# Patient Record
Sex: Female | Born: 1970 | Race: White | Hispanic: No | Marital: Married | Smoking: Never smoker
Health system: Southern US, Community
[De-identification: ages and names within clinical notes are randomized; demographics above are authoritative.]

## PROBLEM LIST (undated history)

## (undated) DIAGNOSIS — F419 Anxiety disorder, unspecified: Secondary | ICD-10-CM

## (undated) DIAGNOSIS — E785 Hyperlipidemia, unspecified: Secondary | ICD-10-CM

## (undated) HISTORY — PX: BREAST BIOPSY: SHX20

## (undated) HISTORY — PX: BREAST EXCISIONAL BIOPSY: SUR124

## (undated) HISTORY — DX: Hyperlipidemia, unspecified: E78.5

## (undated) HISTORY — DX: Anxiety disorder, unspecified: F41.9

---

## 1999-01-10 HISTORY — PX: TUBAL LIGATION: SHX77

## 2004-03-10 ENCOUNTER — Ambulatory Visit: Payer: Self-pay

## 2006-10-25 ENCOUNTER — Ambulatory Visit: Payer: Self-pay

## 2009-01-21 ENCOUNTER — Ambulatory Visit: Payer: Self-pay

## 2010-09-09 ENCOUNTER — Ambulatory Visit: Payer: Self-pay

## 2013-09-19 DIAGNOSIS — E785 Hyperlipidemia, unspecified: Secondary | ICD-10-CM | POA: Insufficient documentation

## 2013-09-19 DIAGNOSIS — K5909 Other constipation: Secondary | ICD-10-CM | POA: Insufficient documentation

## 2013-09-19 DIAGNOSIS — F419 Anxiety disorder, unspecified: Secondary | ICD-10-CM | POA: Insufficient documentation

## 2014-12-17 ENCOUNTER — Other Ambulatory Visit: Payer: Self-pay | Admitting: Nurse Practitioner

## 2014-12-17 DIAGNOSIS — R140 Abdominal distension (gaseous): Secondary | ICD-10-CM

## 2014-12-22 ENCOUNTER — Ambulatory Visit
Admission: RE | Admit: 2014-12-22 | Discharge: 2014-12-22 | Disposition: A | Discharge status: Home / Self Care | Payer: BLUE CROSS/BLUE SHIELD | Source: Ambulatory Visit | Attending: Nurse Practitioner | Admitting: Nurse Practitioner

## 2014-12-22 DIAGNOSIS — R140 Abdominal distension (gaseous): Secondary | ICD-10-CM

## 2015-01-12 ENCOUNTER — Ambulatory Visit: Payer: BLUE CROSS/BLUE SHIELD

## 2015-10-21 ENCOUNTER — Other Ambulatory Visit: Payer: Self-pay | Admitting: Obstetrics and Gynecology

## 2015-10-21 DIAGNOSIS — Z1231 Encounter for screening mammogram for malignant neoplasm of breast: Secondary | ICD-10-CM

## 2015-11-16 ENCOUNTER — Ambulatory Visit
Admission: RE | Admit: 2015-11-16 | Discharge: 2015-11-16 | Disposition: A | Discharge status: Home / Self Care | Payer: BLUE CROSS/BLUE SHIELD | Source: Ambulatory Visit | Attending: Obstetrics and Gynecology | Admitting: Obstetrics and Gynecology

## 2015-11-16 ENCOUNTER — Encounter (HOSPITAL_COMMUNITY): Payer: Self-pay

## 2015-11-16 DIAGNOSIS — Z1231 Encounter for screening mammogram for malignant neoplasm of breast: Secondary | ICD-10-CM | POA: Diagnosis present

## 2015-11-17 ENCOUNTER — Encounter: Payer: Self-pay | Admitting: Obstetrics and Gynecology

## 2015-11-17 ENCOUNTER — Ambulatory Visit (INDEPENDENT_AMBULATORY_CARE_PROVIDER_SITE_OTHER): Payer: BLUE CROSS/BLUE SHIELD | Admitting: Obstetrics and Gynecology

## 2015-11-17 VITALS — BP 118/71 | HR 73 | Ht 63.0 in | Wt 128.7 lb

## 2015-11-17 DIAGNOSIS — Z01419 Encounter for gynecological examination (general) (routine) without abnormal findings: Secondary | ICD-10-CM | POA: Diagnosis not present

## 2015-11-17 DIAGNOSIS — Z87898 Personal history of other specified conditions: Secondary | ICD-10-CM | POA: Diagnosis not present

## 2015-11-17 DIAGNOSIS — N951 Menopausal and female climacteric states: Secondary | ICD-10-CM | POA: Diagnosis not present

## 2015-11-17 DIAGNOSIS — N912 Amenorrhea, unspecified: Secondary | ICD-10-CM | POA: Diagnosis not present

## 2015-11-17 DIAGNOSIS — Z8742 Personal history of other diseases of the female genital tract: Secondary | ICD-10-CM

## 2015-11-17 NOTE — Progress Notes (Signed)
GYNECOLOGY ANNUAL PHYSICAL EXAM PROGRESS NOTE  Subjective:    Kelby Famngela K Boggan is a 45 y.o. [phone] female who presents to establish care, and for an annual exam.  The patient is sexually active. The patient wears seatbelts: yes. The patient participates in regular exercise: no. Has the patient ever been transfused or tattooed?: yes, tattoed. The patient reports that there is not domestic violence in her life.   The patient has the following complaint(s) today:  - Patient notes that she has not had a menstrual cycle since April 2017.  Has taken a pregnancy test in May, which was negative.    Gynecologic History Patient's last menstrual period was 04/10/2015 (exact date). Menstrual History: OB History    Gravida Para Term Preterm AB Living   4 3 3   1 3    SAB TAB Ectopic Multiple Live Births   1       263      Menarche age: [age] Patient's last menstrual period was 04/10/2015 (exact date). Period Duration (Days): 3-4 Period Pattern: (!) Irregular Menstrual Flow: Moderate Dysmenorrhea: None  Contraception: tubal ligation History of STI's: Denies Last Pap: 2016. Results were: normal.  Notes h/o abnormal pap smear with colposcopy and biopsy in 2015. Last mammogram: 11/2015 (performed yesterday). Results were: pending.  Denies h/o abnormal mammograms.    Obstetric History   G4   P3   T3   P0   A1   L3    SAB1   TAB0   Ectopic0   Multiple0   Live Births3     # Outcome Date GA Lbr Len/2nd Weight Sex Delivery Anes PTL Lv  4 Term      Vag-Spont   LIV  3 SAB           2 Term      Vag-Spont   LIV  1 Term      Vag-Spont   LIV      Past Medical History:  Diagnosis Date  . Anxiety   . Hyperlipemia     Past Surgical History:  Procedure Laterality Date  . BREAST BIOPSY Left    neg  . TUBAL LIGATION  2001    Family History  Problem Relation Age of Onset  . Hypertension Mother   . Diabetes Father   . Hypertension Father   . COPD Father   . Cancer Sister   . Breast  cancer Neg Hx     Social History   Social History  . Marital status: Married    Spouse name: N/A  . Number of children: N/A  . Years of education: N/A   Occupational History  . Not on file.   Social History Main Topics  . Smoking status: Never Smoker  . Smokeless tobacco: Never Used  . Alcohol use No  . Drug use: No  . Sexual activity: Yes    Birth control/ protection: Surgical   Other Topics Concern  . Not on file   Social History Narrative  . No narrative on file    No current outpatient prescriptions on file prior to visit.   No current facility-administered medications on file prior to visit.     No Known Allergies   Review of Systems Constitutional: negative for chills, fatigue, fevers and sweats Eyes: negative for irritation, redness and visual disturbance Ears, nose, mouth, throat, and face: negative for hearing loss, nasal congestion, snoring and tinnitus Respiratory: negative for asthma, cough, sputum Cardiovascular: negative for chest pain, dyspnea,  exertional chest pressure/discomfort, irregular heart beat, palpitations and syncope Gastrointestinal: negative for abdominal pain, change in bowel habits, nausea and vomiting Genitourinary: negative for abnormal menstrual periods, genital lesions, sexual problems and vaginal discharge, dysuria and urinary incontinence Integument/breast: negative for breast lump, breast tenderness and nipple discharge Hematologic/lymphatic: negative for bleeding and easy bruising Musculoskeletal:negative for back pain and muscle weakness Neurological: negative for dizziness, headaches, vertigo and weakness Endocrine: negative for diabetic symptoms including polydipsia, polyuria and skin dryness Allergic/Immunologic: negative for hay fever and urticaria        Objective:  Blood pressure 118/71, pulse 73, height 5\' 3"  (1.6 m), weight 128 lb 11.2 oz (58.4 kg), last menstrual period 04/10/2015. Body mass index is 22.8  kg/m.  General Appearance:    Alert, cooperative, no distress, appears stated age  Head:    Normocephalic, without obvious abnormality, atraumatic  Eyes:    PERRL, conjunctiva/corneas clear, EOM's intact, both eyes  Ears:    Normal external ear canals, both ears  Nose:   Nares normal, septum midline, mucosa normal, no drainage or sinus tenderness  Throat:   Lips, mucosa, and tongue normal; teeth and gums normal  Neck:   Supple, symmetrical, trachea midline, no adenopathy; thyroid: no enlargement/tenderness/nodules; no carotid bruit or JVD  Back:     Symmetric, no curvature, ROM normal, no CVA tenderness  Lungs:     Clear to auscultation bilaterally, respirations unlabored  Chest Wall:    No tenderness or deformity   Heart:    Regular rate and rhythm, S1 and S2 normal, no murmur, rub or gallop  Breast Exam:    No tenderness, masses, or nipple abnormality  Abdomen:     Soft, non-tender, bowel sounds active all four quadrants, no masses, no organomegaly.    Genitalia:    Pelvic:external genitalia normal, vagina without lesions, discharge, or tenderness, rectovaginal septum  normal. Cervix normal in appearance, no cervical motion tenderness, no adnexal masses or tenderness.  Uterus normal size, shape, mobile, regular contours, nontender.  Rectal:    Normal external sphincter.  No hemorrhoids appreciated. Internal exam not done.   Extremities:   Extremities normal, atraumatic, no cyanosis or edema  Pulses:   2+ and symmetric all extremities  Skin:   Skin color, texture, turgor normal, no rashes or lesions  Lymph nodes:   Cervical, supraclavicular, and axillary nodes normal  Neurologic:   CNII-XII intact, normal strength, sensation and reflexes throughout   .  Labs:  No results found for: WBC, HGB, HCT, MCV, PLT  No results found for: CREATININE, BUN, NA, K, CL, CO2  No results found for: ALT, AST, GGT, ALKPHOS, BILITOT  No results found for: TSH   Assessment:    Healthy female exam.    H/o abnormal pap smear  Amenorrhea  Plan:     Pap smear performed today. Await pap smear results. Blood tests: Estradiol, FSH, LH and Progesterone level.  Patient notes having annual lab work completed in past few months.  Breast self exam technique reviewed and patient encouraged to perform self-exam monthly.  Await mammogram results.  Contraception: tubal ligation. Diagnosis of amenorrhea explained in detail, including differential.  Patient likely perimenopausal, will check labs.  Discussed healthy lifestyle modifications.  Declines flu vaccine.  RTC in 1 year for annual exam.    Hildred LaserAnika Talani Brazee, MD [facility]

## 2015-11-17 NOTE — Patient Instructions (Signed)

## 2015-11-18 ENCOUNTER — Encounter: Payer: Self-pay | Admitting: Obstetrics and Gynecology

## 2015-11-18 LAB — FSH/LH
FSH: 42.3 m[IU]/mL
LH: 35.4 m[IU]/mL

## 2015-11-18 LAB — ESTRADIOL: Estradiol: 58.9 pg/mL

## 2015-11-18 LAB — PROGESTERONE: Progesterone: 0.1 ng/mL

## 2015-11-21 LAB — PAP IG AND HPV HIGH-RISK
HPV, high-risk: NEGATIVE
PAP SMEAR COMMENT: 0

## 2015-12-16 ENCOUNTER — Telehealth: Payer: Self-pay | Admitting: Obstetrics and Gynecology

## 2015-12-16 NOTE — Telephone Encounter (Signed)
Pt going thru menapause and feels so drained. She would like suggestions for something to take for it?

## 2015-12-16 NOTE — Telephone Encounter (Addendum)
No, she does not require an appointment at this time. Please advise her of the following:   Eat a healthy and nutritious diet. Ask your health care provider if you need help changing your diet.  Drink enough fluid to keep your urine clear or pale yellow.  Practice ways of relaxing, such as yoga, meditation, massage therapy, or acupuncture.  Exercise regularly.  Change situations that cause you stress. Try to keep your work and personal routine reasonable.  Limit alcohol intake to no more than 1 drink per day for nonpregnant women and 2 drinks per day for men. One drink equals 12 ounces of beer, 5 ounces of wine, or 1 ounces of hard liquor.  Ensure adequate sleep.   Take a multivitamin.   She can also take OTC Vitamin B12 supplements over the counter. If her fatigue has not improved in 1 month, we can also check labs (such as her thyroid and vitamin levels) to assess if there is any other medical reason for her fatigue besides menopause, and discuss possible hormonal therapy in a visit.

## 2015-12-16 NOTE — Telephone Encounter (Signed)
Called pt she states that she is very fatigued regardless of the amount of rest she gets. Advised pt to begin taking vitamin B12 5,000 per Dr.Cherry to call back in 1 month if no improvement.

## 2015-12-16 NOTE — Telephone Encounter (Signed)
Please advise, pt was last seen 11/17/15 for an annual do you want her to come back in?

## 2015-12-16 NOTE — Telephone Encounter (Signed)
Called pt no answer. LM for pt to call back.  

## 2015-12-17 ENCOUNTER — Encounter: Payer: Self-pay | Admitting: Obstetrics and Gynecology

## 2016-06-15 ENCOUNTER — Other Ambulatory Visit: Payer: Self-pay | Admitting: Physician Assistant

## 2016-06-15 DIAGNOSIS — M5030 Other cervical disc degeneration, unspecified cervical region: Secondary | ICD-10-CM

## 2016-06-22 ENCOUNTER — Ambulatory Visit
Admission: RE | Admit: 2016-06-22 | Discharge: 2016-06-22 | Disposition: A | Discharge status: Home / Self Care | Payer: BLUE CROSS/BLUE SHIELD | Source: Ambulatory Visit | Attending: Physician Assistant | Admitting: Physician Assistant

## 2016-06-22 DIAGNOSIS — M5030 Other cervical disc degeneration, unspecified cervical region: Secondary | ICD-10-CM | POA: Diagnosis not present

## 2016-06-22 DIAGNOSIS — M4802 Spinal stenosis, cervical region: Secondary | ICD-10-CM | POA: Insufficient documentation

## 2016-06-28 ENCOUNTER — Encounter: Payer: Self-pay | Admitting: Obstetrics and Gynecology

## 2016-09-13 ENCOUNTER — Telehealth: Payer: Self-pay | Admitting: Obstetrics and Gynecology

## 2016-09-13 NOTE — Telephone Encounter (Signed)
Patient called stating she has low energy. She has tried b12 over the counter but it is not making a difference. Is there something else she can try? Please Advise.

## 2016-09-13 NOTE — Telephone Encounter (Signed)
Called pt, no answer. Unable to leave message as voicemail is full. Will send mychart message.  

## 2016-11-22 ENCOUNTER — Ambulatory Visit (INDEPENDENT_AMBULATORY_CARE_PROVIDER_SITE_OTHER): Payer: BLUE CROSS/BLUE SHIELD | Admitting: Obstetrics and Gynecology

## 2016-11-22 ENCOUNTER — Encounter: Payer: Self-pay | Admitting: Obstetrics and Gynecology

## 2016-11-22 VITALS — BP 121/76 | HR 72 | Ht 63.0 in | Wt 137.0 lb

## 2016-11-22 DIAGNOSIS — Z1231 Encounter for screening mammogram for malignant neoplasm of breast: Secondary | ICD-10-CM

## 2016-11-22 DIAGNOSIS — Z01419 Encounter for gynecological examination (general) (routine) without abnormal findings: Secondary | ICD-10-CM

## 2016-11-22 DIAGNOSIS — N951 Menopausal and female climacteric states: Secondary | ICD-10-CM | POA: Diagnosis not present

## 2016-11-22 DIAGNOSIS — Z124 Encounter for screening for malignant neoplasm of cervix: Secondary | ICD-10-CM | POA: Diagnosis not present

## 2016-11-22 DIAGNOSIS — R5383 Other fatigue: Secondary | ICD-10-CM

## 2016-11-22 DIAGNOSIS — Z1239 Encounter for other screening for malignant neoplasm of breast: Secondary | ICD-10-CM

## 2016-11-22 NOTE — Patient Instructions (Addendum)
Fatigue Fatigue is feeling tired all of the time, a lack of energy, or a lack of motivation. Occasional or mild fatigue is often a normal response to activity or life in general. However, long-lasting (chronic) or extreme fatigue may indicate an underlying medical condition. Follow these instructions at home: Watch your fatigue for any changes. The following actions may help to lessen any discomfort you are feeling:  Talk to your health care provider about how much sleep you need each night. Try to get the required amount every night.  Take medicines only as directed by your health care provider.  Eat a healthy and nutritious diet. Ask your health care provider if you need help changing your diet.  Drink enough fluid to keep your urine clear or pale yellow.  Practice ways of relaxing, such as yoga, meditation, massage therapy, or acupuncture.  Exercise regularly.  Change situations that cause you stress. Try to keep your work and personal routine reasonable.  Do not abuse illegal drugs.  Limit alcohol intake to no more than 1 drink per day for nonpregnant women and 2 drinks per day for men. One drink equals 12 ounces of beer, 5 ounces of wine, or 1 ounces of hard liquor.  Take a multivitamin, if directed by your health care provider.  Contact a health care provider if:  Your fatigue does not get better.  You have a fever.  You have unintentional weight loss or gain.  You have headaches.  You have difficulty: ? Falling asleep. ? Sleeping throughout the night.  You feel angry, guilty, anxious, or sad.  You are unable to have a bowel movement (constipation).  You skin is dry.  Your legs or another part of your body is swollen. Get help right away if:  You feel confused.  Your vision is blurry.  You feel faint or pass out.  You have a severe headache.  You have severe abdominal, pelvic, or back pain.  You have chest pain, shortness of breath, or an irregular or  fast heartbeat.  You are unable to urinate or you urinate less than normal.  You develop abnormal bleeding, such as bleeding from the rectum, vagina, nose, lungs, or nipples.  You vomit blood.  You have thoughts about harming yourself or committing suicide.  You are worried that you might harm someone else. This information is not intended to replace advice given to you by your health care provider. Make sure you discuss any questions you have with your health care provider. Document Released: 10/23/2006 Document Revised: 06/03/2015 Document Reviewed: 04/29/2013 Elsevier Interactive Patient Education  2018 [employer] Maintenance for Postmenopausal Women Menopause is a normal process in which your reproductive ability comes to an end. This process happens gradually over a span of months to years, usually between the ages of 75 and 64. Menopause is complete when you have missed 12 consecutive menstrual periods. It is important to talk with your health care provider about some of the most common conditions that affect postmenopausal women, such as heart disease, cancer, and bone loss (osteoporosis). Adopting a healthy lifestyle and getting preventive care can help to promote your health and wellness. Those actions can also lower your chances of developing some of these common conditions. What should I know about menopause? During menopause, you may experience a number of symptoms, such as:  Moderate-to-severe hot flashes.  Night sweats.  Decrease in sex drive.  Mood swings.  Headaches.  Tiredness.  Irritability.  Memory problems.  Insomnia.  Choosing to treat or not to treat menopausal changes is an individual decision that you make with your health care provider. What should I know about hormone replacement therapy and supplements? Hormone therapy products are effective for treating symptoms that are associated with menopause, such as hot flashes and night sweats.  Hormone replacement carries certain risks, especially as you become older. If you are thinking about using estrogen or estrogen with progestin treatments, discuss the benefits and risks with your health care provider. What should I know about heart disease and stroke? Heart disease, heart attack, and stroke become more likely as you age. This may be due, in part, to the hormonal changes that your body experiences during menopause. These can affect how your body processes dietary fats, triglycerides, and cholesterol. Heart attack and stroke are both medical emergencies. There are many things that you can do to help prevent heart disease and stroke:  Have your blood pressure checked at least every 1-2 years. High blood pressure causes heart disease and increases the risk of stroke.  If you are 15-7 years old, ask your health care provider if you should take aspirin to prevent a heart attack or a stroke.  Do not use any tobacco products, including cigarettes, chewing tobacco, or electronic cigarettes. If you need help quitting, ask your health care provider.  It is important to eat a healthy diet and maintain a healthy weight. ? Be sure to include plenty of vegetables, fruits, low-fat dairy products, and lean protein. ? Avoid eating foods that are high in solid fats, added sugars, or salt (sodium).  Get regular exercise. This is one of the most important things that you can do for your health. ? Try to exercise for at least 150 minutes each week. The type of exercise that you do should increase your heart rate and make you sweat. This is known as moderate-intensity exercise. ? Try to do strengthening exercises at least twice each week. Do these in addition to the moderate-intensity exercise.  Know your numbers.Ask your health care provider to check your cholesterol and your blood glucose. Continue to have your blood tested as directed by your health care provider.  What should I know about cancer  screening? There are several types of cancer. Take the following steps to reduce your risk and to catch any cancer development as early as possible. Breast Cancer  Practice breast self-awareness. ? This means understanding how your breasts normally appear and feel. ? It also means doing regular breast self-exams. Let your health care provider know about any changes, no matter how small.  If you are 55 or older, have a clinician do a breast exam (clinical breast exam or CBE) every year. Depending on your age, family history, and medical history, it may be recommended that you also have a yearly breast X-ray (mammogram).  If you have a family history of breast cancer, talk with your health care provider about genetic screening.  If you are at high risk for breast cancer, talk with your health care provider about having an MRI and a mammogram every year.  Breast cancer (BRCA) gene test is recommended for women who have family members with BRCA-related cancers. Results of the assessment will determine the need for genetic counseling and BRCA1 and for BRCA2 testing. BRCA-related cancers include these types: ? Breast. This occurs in males or females. ? Ovarian. ? Tubal. This may also be called fallopian tube cancer. ? Cancer of the abdominal or pelvic lining (peritoneal cancer). ?  Prostate. ? Pancreatic.  Cervical, Uterine, and Ovarian Cancer Your health care provider may recommend that you be screened regularly for cancer of the pelvic organs. These include your ovaries, uterus, and vagina. This screening involves a pelvic exam, which includes checking for microscopic changes to the surface of your cervix (Pap test).  For women ages 21-65, health care providers may recommend a pelvic exam and a Pap test every three years. For women ages 84-65, they may recommend the Pap test and pelvic exam, combined with testing for human papilloma virus (HPV), every five years. Some types of HPV increase your  risk of cervical cancer. Testing for HPV may also be done on women of any age who have unclear Pap test results.  Other health care providers may not recommend any screening for nonpregnant women who are considered low risk for pelvic cancer and have no symptoms. Ask your health care provider if a screening pelvic exam is right for you.  If you have had past treatment for cervical cancer or a condition that could lead to cancer, you need Pap tests and screening for cancer for at least 20 years after your treatment. If Pap tests have been discontinued for you, your risk factors (such as having a new sexual partner) need to be reassessed to determine if you should start having screenings again. Some women have medical problems that increase the chance of getting cervical cancer. In these cases, your health care provider may recommend that you have screening and Pap tests more often.  If you have a family history of uterine cancer or ovarian cancer, talk with your health care provider about genetic screening.  If you have vaginal bleeding after reaching menopause, tell your health care provider.  There are currently no reliable tests available to screen for ovarian cancer.  Lung Cancer Lung cancer screening is recommended for adults 78-70 years old who are at high risk for lung cancer because of a history of smoking. A yearly low-dose CT scan of the lungs is recommended if you:  Currently smoke.  Have a history of at least 30 pack-years of smoking and you currently smoke or have quit within the past 15 years. A pack-year is smoking an average of one pack of cigarettes per day for one year.  Yearly screening should:  Continue until it has been 15 years since you quit.  Stop if you develop a health problem that would prevent you from having lung cancer treatment.  Colorectal Cancer  This type of cancer can be detected and can often be prevented.  Routine colorectal cancer screening usually  begins at age 85 and continues through age 71.  If you have risk factors for colon cancer, your health care provider may recommend that you be screened at an earlier age.  If you have a family history of colorectal cancer, talk with your health care provider about genetic screening.  Your health care provider may also recommend using home test kits to check for hidden blood in your stool.  A small camera at the end of a tube can be used to examine your colon directly (sigmoidoscopy or colonoscopy). This is done to check for the earliest forms of colorectal cancer.  Direct examination of the colon should be repeated every 5-10 years until age 30. However, if early forms of precancerous polyps or small growths are found or if you have a family history or genetic risk for colorectal cancer, you may need to be screened more often.  Skin  Cancer  Check your skin from head to toe regularly.  Monitor any moles. Be sure to tell your health care provider: ? About any new moles or changes in moles, especially if there is a change in a mole's shape or color. ? If you have a mole that is larger than the size of a pencil eraser.  If any of your family members has a history of skin cancer, especially at a young age, talk with your health care provider about genetic screening.  Always use sunscreen. Apply sunscreen liberally and repeatedly throughout the day.  Whenever you are outside, protect yourself by wearing long sleeves, pants, a wide-brimmed hat, and sunglasses.  What should I know about osteoporosis? Osteoporosis is a condition in which bone destruction happens more quickly than new bone creation. After menopause, you may be at an increased risk for osteoporosis. To help prevent osteoporosis or the bone fractures that can happen because of osteoporosis, the following is recommended:  If you are 36-26 years old, get at least 1,000 mg of calcium and at least 600 mg of vitamin D per day.  If you  are older than age 81 but younger than age 13, get at least 1,200 mg of calcium and at least 600 mg of vitamin D per day.  If you are older than age 44, get at least 1,200 mg of calcium and at least 800 mg of vitamin D per day.  Smoking and excessive alcohol intake increase the risk of osteoporosis. Eat foods that are rich in calcium and vitamin D, and do weight-bearing exercises several times each week as directed by your health care provider. What should I know about how menopause affects my mental health? Depression may occur at any age, but it is more common as you become older. Common symptoms of depression include:  Low or sad mood.  Changes in sleep patterns.  Changes in appetite or eating patterns.  Feeling an overall lack of motivation or enjoyment of activities that you previously enjoyed.  Frequent crying spells.  Talk with your health care provider if you think that you are experiencing depression. What should I know about immunizations? It is important that you get and maintain your immunizations. These include:  Tetanus, diphtheria, and pertussis (Tdap) booster vaccine.  Influenza every year before the flu season begins.  Pneumonia vaccine.  Shingles vaccine.  Your health care provider may also recommend other immunizations. This information is not intended to replace advice given to you by your health care provider. Make sure you discuss any questions you have with your health care provider. Document Released: 02/17/2005 Document Revised: 07/16/2015 Document Reviewed: 09/29/2014 Elsevier Interactive Patient Education  2018 [employer].

## 2016-11-22 NOTE — Progress Notes (Signed)
GYNECOLOGY ANNUAL PHYSICAL EXAM PROGRESS NOTE  Subjective:    Laura Ruiz is a 46 y.o. [phone] female who presents for an annual exam.  She is currently postmenopausal (x 1 year) and denies postmenopausal bleeding.   The patient is sexually active.  The patient wears seatbelts: yes. The patient participates in regular exercise: no. Has the patient ever been transfused or tattooed?: yes, tattoed. She reports that there is not domestic violence in her life.   Marcelina has the following complaint(s) today:  - Complains of fatigue over the past year, worsening in the past several months.  Notes that ever since the start of menopause last year she has noticed it, but now has become worse.  - Noting hot flushes, mild to moderate, occurring at least several days out of the week.    Gynecologic History Patient's last menstrual period was 04/10/2015. Menstrual History: OB History    Gravida Para Term Preterm AB Living   4 3 3   1 3    SAB TAB Ectopic Multiple Live Births   1       26      Menarche age: 46 Patient's last menstrual period was 04/10/2015.    Contraception: tubal ligation History of STI's: Denies Last Pap: 2016. Results were: normal.  Notes h/o abnormal pap smear with colposcopy and biopsy in 2015. Last mammogram: 11/2015 (performed yesterday). Results were: pending.  Denies h/o abnormal mammograms.    Obstetric History   G4   P3   T3   P0   A1   L3    SAB1   TAB0   Ectopic0   Multiple0   Live Births3     # Outcome Date GA Lbr Len/2nd Weight Sex Delivery Anes PTL Lv  4 Term      Vag-Spont   LIV  3 SAB           2 Term      Vag-Spont   LIV  1 Term      Vag-Spont   LIV      Past Medical History:  Diagnosis Date  . Anxiety   . Hyperlipemia     Past Surgical History:  Procedure Laterality Date  . BREAST BIOPSY Left    neg  . TUBAL LIGATION  2001    Family History  Problem Relation Age of Onset  . Hypertension Mother   . Diabetes Father   .  Hypertension Father   . COPD Father   . Cancer Sister   . Breast cancer Neg Hx     Social History   Socioeconomic History  . Marital status: Married    Spouse name: Not on file  . Number of children: Not on file  . Years of education: Not on file  . Highest education level: Not on file  Social Needs  . Financial resource strain: Not on file  . Food insecurity - worry: Not on file  . Food insecurity - inability: Not on file  . Transportation needs - medical: Not on file  . Transportation needs - non-medical: Not on file  Occupational History  . Not on file  Tobacco Use  . Smoking status: Never Smoker  . Smokeless tobacco: Never Used  Substance and Sexual Activity  . Alcohol use: No  . Drug use: No  . Sexual activity: Yes    Birth control/protection: Surgical  Other Topics Concern  . Not on file  Social History Narrative  . Not on file  Current Outpatient Medications on File Prior to Visit  Medication Sig Dispense Refill  . ALPRAZolam (XANAX) 0.5 MG tablet TAKE ONE (1) TABLET EACH DAY AS NEEDED FOR PANIC ATTACKS    . citalopram (CELEXA) 40 MG tablet TAKE ONE (1) TABLET EACH DAY    . linaclotide (LINZESS) 290 MCG CAPS capsule TAKE ONE (1) CAPSULE EACH DAY    . Multiple Vitamin (MULTI-VITAMINS) TABS Take by mouth.    . Omega-3 Fatty Acids (FISH OIL PO) Take by mouth.    . simvastatin (ZOCOR) 20 MG tablet Take by mouth.     No current facility-administered medications on file prior to visit.     No Known Allergies   Review of Systems Constitutional: negative for chills, fevers and sweats.  Positive for fatigue (x 1 year).  Eyes: negative for irritation, redness and visual disturbance Ears, nose, mouth, throat, and face: negative for hearing loss, nasal congestion, snoring and tinnitus Respiratory: negative for asthma, cough, sputum Cardiovascular: negative for chest pain, dyspnea, exertional chest pressure/discomfort, irregular heart beat, palpitations and  syncope Gastrointestinal: negative for abdominal pain, change in bowel habits, nausea and vomiting Genitourinary: negative for abnormal menstrual periods, genital lesions, sexual problems and vaginal discharge, dysuria and urinary incontinence Integument/breast: negative for breast lump, breast tenderness and nipple discharge Hematologic/lymphatic: negative for bleeding and easy bruising Musculoskeletal:negative for back pain and muscle weakness Neurological: negative for dizziness, headaches, vertigo and weakness Endocrine: negative for diabetic symptoms including polydipsia, polyuria and skin dryness.  Positive for hot flushes.  Allergic/Immunologic: negative for hay fever and urticaria       Objective:  Blood pressure 121/76, pulse 72, height 5\' 3"  (1.6 m), weight 137 lb (62.1 kg), last menstrual period 04/10/2015. Body mass index is 24.27 kg/m.  General Appearance:    Alert, cooperative, no distress, appears stated age  Head:    Normocephalic, without obvious abnormality, atraumatic  Eyes:    PERRL, conjunctiva/corneas clear, EOM's intact, both eyes  Ears:    Normal external ear canals, both ears  Nose:   Nares normal, septum midline, mucosa normal, no drainage or sinus tenderness  Throat:   Lips, mucosa, and tongue normal; teeth and gums normal  Neck:   Supple, symmetrical, trachea midline, no adenopathy; thyroid: no enlargement/tenderness/nodules; no carotid bruit or JVD  Back:     Symmetric, no curvature, ROM normal, no CVA tenderness  Lungs:     Clear to auscultation bilaterally, respirations unlabored  Chest Wall:    No tenderness or deformity   Heart:    Regular rate and rhythm, S1 and S2 normal, no murmur, rub or gallop  Breast Exam:    No tenderness, masses, or nipple abnormality  Abdomen:     Soft, non-tender, bowel sounds active all four quadrants, no masses, no organomegaly.    Genitalia:    Pelvic:external genitalia normal, vagina without lesions, discharge, or tenderness,  rectovaginal septum  normal. Cervix normal in appearance, no cervical motion tenderness, no adnexal masses or tenderness.  Uterus normal size, shape, mobile, regular contours, nontender.  Rectal:    Normal external sphincter.  No hemorrhoids appreciated. Internal exam not done.   Extremities:   Extremities normal, atraumatic, no cyanosis or edema  Pulses:   2+ and symmetric all extremities  Skin:   Skin color, texture, turgor normal, no rashes or lesions  Lymph nodes:   Cervical, supraclavicular, and axillary nodes normal  Neurologic:   CNII-XII intact, normal strength, sensation and reflexes throughout   .  Labs:  No results found for: WBC, HGB, HCT, MCV, PLT  No results found for: CREATININE, BUN, NA, K, CL, CO2  No results found for: ALT, AST, GGT, ALKPHOS, BILITOT  No results found for: TSH   Assessment:   Routine gynecologic exam.  H/o abnormal pap smear Menopausal hot flushes Fatigue  Plan:    - Pap smear performed today. Await pap smear results.  If normal, can return to q 3 year screening.  - Blood tests: Vitamin D, TSH, Vitamin B12 levels ordered to assess for fatigue.  Has had annual labs performed by PCP (not in Epic) - Breast self exam technique reviewed and patient encouraged to perform self-exam monthly.  Mammogram ordered. - Contraception: tubal ligation. - Discussed healthy lifestyle modifications.   - Patient with bothersome menopausal vasomotor symptoms. Discussed lifestyle interventions such as wearing light clothing, remaining in cool environments, having fan/air conditioner in the room, avoiding hot beverages etc.  Discussed using hormone therapy and concerns about increased risk of heart disease, cerebrovascular disease, thromboembolic disease,  and breast cancer.  Also discussed other medical options such as Paxil, Effexor, Brisdelle, Clonidine,  or Neurontin.   Also discussed alternative therapies such as herbal remedies but cautioned that most of the  products contained phytoestrogens (plant estrogens) in unregulated amounts which can have the same effects on the body as the pharmaceutical estrogen preparations.  Also referred her to www.menopause.org for other alternative options. Patient desires to read over options first, but will likely try herbal remedies first.  - Fatigue worsening, may be secondary to menopause.  Discussed exercise, hormonal supplementation (HRT as mentioned for hot flushes), OTC supplementation. Has tried liquid B12 but did not notice much of a difference. Inquires into Vitamin B12 shots. Discussed need for assessment of labs, then will determine if appropriate.  - Follow up in 1 year, or sooner as needed.    Declines flu vaccine.  RTC in 1 year for annual exam.    Hildred Laserherry, Tameah Mihalko, MD [facility]

## 2016-11-23 LAB — TSH: TSH: 0.974 u[IU]/mL (ref 0.450–4.500)

## 2016-11-23 LAB — VITAMIN B12: Vitamin B-12: 1729 pg/mL — ABNORMAL HIGH (ref 232–1245)

## 2016-11-23 LAB — VITAMIN D 25 HYDROXY (VIT D DEFICIENCY, FRACTURES): Vit D, 25-Hydroxy: 22.7 ng/mL — ABNORMAL LOW (ref 30.0–100.0)

## 2016-11-26 LAB — IGP, COBASHPV16/18
HPV 16: NEGATIVE
HPV 18: NEGATIVE
HPV OTHER HR TYPES: NEGATIVE
PAP Smear Comment: 0

## 2017-07-27 ENCOUNTER — Telehealth: Payer: Self-pay | Admitting: Obstetrics and Gynecology

## 2017-07-27 NOTE — Telephone Encounter (Signed)
Pt was sent a MyChart message concerning her phone call to the office.

## 2017-07-27 NOTE — Telephone Encounter (Signed)
The patient called and asked if Dr. Valentino Saxonherry prescribes a specific medication, I informed the patient that a nurse will call her back and confirm if that is possible for Dr. Valentino Saxonherry to send that in for her. Please advise.

## 2017-07-27 NOTE — Telephone Encounter (Signed)
Pt was called no answer LM to call the office to discuss the reason for her call.

## 2017-11-28 ENCOUNTER — Ambulatory Visit (INDEPENDENT_AMBULATORY_CARE_PROVIDER_SITE_OTHER): Payer: BLUE CROSS/BLUE SHIELD | Admitting: Obstetrics and Gynecology

## 2017-11-28 ENCOUNTER — Encounter: Payer: Self-pay | Admitting: Obstetrics and Gynecology

## 2017-11-28 VITALS — BP 136/87 | HR 76 | Ht 63.0 in | Wt 134.0 lb

## 2017-11-28 DIAGNOSIS — N951 Menopausal and female climacteric states: Secondary | ICD-10-CM

## 2017-11-28 DIAGNOSIS — R5382 Chronic fatigue, unspecified: Secondary | ICD-10-CM | POA: Diagnosis not present

## 2017-11-28 DIAGNOSIS — Z1239 Encounter for other screening for malignant neoplasm of breast: Secondary | ICD-10-CM

## 2017-11-28 DIAGNOSIS — Z01419 Encounter for gynecological examination (general) (routine) without abnormal findings: Secondary | ICD-10-CM | POA: Diagnosis not present

## 2017-11-28 NOTE — Progress Notes (Signed)
PT is present today for her annual exam. Pt stated that she has been doing self-breast exams monthly. Pt stated that she is doing well and denies any issues. No problems or concerns.     

## 2017-11-28 NOTE — Progress Notes (Signed)
GYNECOLOGY ANNUAL PHYSICAL EXAM PROGRESS NOTE  Subjective:    Laura Ruiz is a 47 y.o. [phone] female who presents for an annual exam.  She is currently postmenopausal (x 2 years) and denies postmenopausal bleeding.   The patient is sexually active.  The patient wears seatbelts: yes. The patient participates in regular exercise: no. Has the patient ever been transfused or tattooed?: yes, tattoed. She reports that there is not domestic violence in her life.   Laura Ruiz has the following complaint(s) today:  1.  None   Gynecologic History  Menarche age: 58 Patient's last menstrual period was 04/10/2015. Patient is postmenopausal.  Contraception: tubal ligation History of STI's: Denies Last Pap: 11/2016. Results were: normal.  Notes h/o abnormal pap smear with colposcopy and biopsy in 2015. Last mammogram: 11/2015. Results were: normal.  Denies h/o abnormal mammograms.    OB History  Gravida Para Term Preterm AB Living  4 3 3  0 1 3  SAB TAB Ectopic Multiple Live Births  1 0 0 0 3    # Outcome Date GA Lbr Len/2nd Weight Sex Delivery Anes PTL Lv  4 Term      Vag-Spont   LIV  3 SAB           2 Term      Vag-Spont   LIV  1 Term      Vag-Spont   LIV    Past Medical History:  Diagnosis Date  . Anxiety   . Hyperlipemia     Past Surgical History:  Procedure Laterality Date  . BREAST BIOPSY Left    neg  . TUBAL LIGATION  2001    Family History  Problem Relation Age of Onset  . Hypertension Mother   . Diabetes Father   . Hypertension Father   . COPD Father   . Cancer Sister   . Breast cancer Neg Hx     Social History   Socioeconomic History  . Marital status: Married    Spouse name: Not on file  . Number of children: Not on file  . Years of education: Not on file  . Highest education level: Not on file  Occupational History  . Not on file  Social Needs  . Financial resource strain: Not on file  . Food insecurity:    Worry: Not on file    Inability: Not  on file  . Transportation needs:    Medical: Not on file    Non-medical: Not on file  Tobacco Use  . Smoking status: Never Smoker  . Smokeless tobacco: Never Used  Substance and Sexual Activity  . Alcohol use: No  . Drug use: No  . Sexual activity: Yes    Birth control/protection: Surgical  Lifestyle  . Physical activity:    Days per week: Not on file    Minutes per session: Not on file  . Stress: Not on file  Relationships  . Social connections:    Talks on phone: Not on file    Gets together: Not on file    Attends religious service: Not on file    Active member of club or organization: Not on file    Attends meetings of clubs or organizations: Not on file    Relationship status: Not on file  . Intimate partner violence:    Fear of current or ex partner: Not on file    Emotionally abused: Not on file    Physically abused: Not on file  Forced sexual activity: Not on file  Other Topics Concern  . Not on file  Social History Narrative  . Not on file    Current Outpatient Medications on File Prior to Visit  Medication Sig Dispense Refill  . ALPRAZolam (XANAX) 0.5 MG tablet TAKE ONE (1) TABLET EACH DAY AS NEEDED FOR PANIC ATTACKS    . citalopram (CELEXA) 40 MG tablet TAKE ONE (1) TABLET EACH DAY    . linaclotide (LINZESS) 290 MCG CAPS capsule TAKE ONE (1) CAPSULE EACH DAY    . Multiple Vitamin (MULTI-VITAMINS) TABS Take by mouth.    . Omega-3 Fatty Acids (FISH OIL PO) Take by mouth.    . rosuvastatin (CRESTOR) 40 MG tablet Take 40 mg by mouth daily.     No current facility-administered medications on file prior to visit.     No Known Allergies   Review of Systems Constitutional: negative for chills, fevers and sweats.  Positive for fatigue. Eyes: negative for irritation, redness and visual disturbance Ears, nose, mouth, throat, and face: negative for hearing loss, nasal congestion, snoring and tinnitus Respiratory: negative for asthma, cough,  sputum Cardiovascular: negative for chest pain, dyspnea, exertional chest pressure/discomfort, irregular heart beat, palpitations and syncope Gastrointestinal: negative for abdominal pain, change in bowel habits, nausea and vomiting Genitourinary: negative for abnormal menstrual periods, genital lesions, sexual problems and vaginal discharge, dysuria and urinary incontinence Integument/breast: negative for breast lump, breast tenderness and nipple discharge Hematologic/lymphatic: negative for bleeding and easy bruising Musculoskeletal:negative for back pain and muscle weakness Neurological: negative for dizziness, headaches, vertigo and weakness Endocrine: negative for diabetic symptoms including polydipsia, polyuria and skin dryness.  Positive for hot flushes (mild, not very bothersome).  Allergic/Immunologic: negative for hay fever and urticaria       Objective:  Blood pressure 136/87, pulse 76, height 5\' 3"  (1.6 m), weight 134 lb (60.8 kg). Body mass index is 23.74 kg/m.  General Appearance:    Alert, cooperative, no distress, appears stated age  Head:    Normocephalic, without obvious abnormality, atraumatic  Eyes:    PERRL, conjunctiva/corneas clear, EOM's intact, both eyes  Ears:    Normal external ear canals, both ears  Nose:   Nares normal, septum midline, mucosa normal, no drainage or sinus tenderness  Throat:   Lips, mucosa, and tongue normal; teeth and gums normal  Neck:   Supple, symmetrical, trachea midline, no adenopathy; thyroid: no enlargement/tenderness/nodules; no carotid bruit or JVD  Back:     Symmetric, no curvature, ROM normal, no CVA tenderness  Lungs:     Clear to auscultation bilaterally, respirations unlabored  Chest Wall:    No tenderness or deformity   Heart:    Regular rate and rhythm, S1 and S2 normal, no murmur, rub or gallop  Breast Exam:    No tenderness, masses, or nipple abnormality  Abdomen:     Soft, non-tender, bowel sounds active all four quadrants,  no masses, no organomegaly.    Genitalia:    Pelvic:external genitalia normal, vagina without lesions, discharge, or tenderness, rectovaginal septum  normal. Cervix normal in appearance, no cervical motion tenderness, no adnexal masses or tenderness.  Uterus normal size, shape, mobile, regular contours, nontender.  Rectal:    Normal external sphincter.  No hemorrhoids appreciated. Internal exam not done.   Extremities:   Extremities normal, atraumatic, no cyanosis or edema  Pulses:   2+ and symmetric all extremities  Skin:   Skin color, texture, turgor normal, no rashes or lesions  Lymph nodes:  Cervical, supraclavicular, and axillary nodes normal  Neurologic:   CNII-XII intact, normal strength, sensation and reflexes throughout   .  Labs:   Lab Results  Component Value Date   TSH 0.974 11/22/2016   All other labs reviewed in Care Everywhere (07/04/2017 is date of last labs).  Assessment:   Routine gynecologic exam.  smear Fatigue Vasomotor symptoms  Plan:  - Labs: up to date.  Reviewed in Care Everywhere - Pap smear up to date.  H/o abnormal pap smear with 3 consecutive normal pap smears.  Can return to q 3 year screening.  - Breast self exam technique reviewed and patient encouraged to perform self-exam monthly.  Mammogram ordered. - Contraception: tubal ligation. - Discussed healthy lifestyle modifications.   - Fatigue still present, had negative workup last year.  Continued to encourage exercise, adequate rest, can use OTC vitamin or herbal supplementation.  - Vasomotor symptoms, mild, not bothersome.  Declines treatment at this time.  - Follow up in 1 year, or sooner as needed.  - Declines flu vaccine.  - RTC in 1 year for annual exam.    Hildred Laser, MD [facility]

## 2017-12-01 ENCOUNTER — Encounter: Payer: Self-pay | Admitting: Obstetrics and Gynecology

## 2018-01-10 ENCOUNTER — Ambulatory Visit
Admission: RE | Admit: 2018-01-10 | Discharge: 2018-01-10 | Disposition: A | Discharge status: Home / Self Care | Payer: BLUE CROSS/BLUE SHIELD | Source: Ambulatory Visit | Attending: Obstetrics and Gynecology | Admitting: Obstetrics and Gynecology

## 2018-01-10 DIAGNOSIS — Z1239 Encounter for other screening for malignant neoplasm of breast: Secondary | ICD-10-CM | POA: Diagnosis present

## 2018-03-15 ENCOUNTER — Other Ambulatory Visit: Payer: Self-pay | Admitting: Nurse Practitioner

## 2018-03-15 DIAGNOSIS — R140 Abdominal distension (gaseous): Secondary | ICD-10-CM

## 2018-03-29 ENCOUNTER — Ambulatory Visit: Admission: RE | Admit: 2018-03-29 | Payer: BLUE CROSS/BLUE SHIELD | Source: Ambulatory Visit

## 2018-04-29 ENCOUNTER — Ambulatory Visit: Admission: RE | Admit: 2018-04-29 | Payer: BLUE CROSS/BLUE SHIELD | Source: Ambulatory Visit

## 2018-06-06 ENCOUNTER — Other Ambulatory Visit: Payer: Self-pay

## 2018-06-06 ENCOUNTER — Ambulatory Visit
Admission: RE | Admit: 2018-06-06 | Discharge: 2018-06-06 | Disposition: A | Discharge status: Home / Self Care | Payer: BLUE CROSS/BLUE SHIELD | Source: Ambulatory Visit | Attending: Nurse Practitioner | Admitting: Nurse Practitioner

## 2018-06-06 DIAGNOSIS — R140 Abdominal distension (gaseous): Secondary | ICD-10-CM | POA: Insufficient documentation

## 2018-06-06 MED ORDER — IOHEXOL 300 MG/ML  SOLN
85.0000 mL | Freq: Once | INTRAMUSCULAR | Status: AC | PRN
  Administered 2018-06-06: 85 mL via INTRAVENOUS

## 2018-11-11 ENCOUNTER — Other Ambulatory Visit: Payer: Self-pay

## 2018-11-11 DIAGNOSIS — Z20822 Contact with and (suspected) exposure to covid-19: Secondary | ICD-10-CM

## 2018-11-12 LAB — NOVEL CORONAVIRUS, NAA: SARS-CoV-2, NAA: NOT DETECTED

## 2018-12-03 ENCOUNTER — Encounter: Payer: BLUE CROSS/BLUE SHIELD | Admitting: Obstetrics and Gynecology

## 2018-12-10 ENCOUNTER — Other Ambulatory Visit: Payer: Self-pay | Admitting: Obstetrics and Gynecology

## 2018-12-10 ENCOUNTER — Other Ambulatory Visit: Payer: Self-pay | Admitting: Family Medicine

## 2018-12-10 DIAGNOSIS — Z1231 Encounter for screening mammogram for malignant neoplasm of breast: Secondary | ICD-10-CM

## 2018-12-13 ENCOUNTER — Ambulatory Visit (INDEPENDENT_AMBULATORY_CARE_PROVIDER_SITE_OTHER): Payer: BC Managed Care – PPO | Admitting: Obstetrics and Gynecology

## 2018-12-13 ENCOUNTER — Telehealth: Payer: Self-pay

## 2018-12-13 ENCOUNTER — Encounter: Payer: Self-pay | Admitting: Obstetrics and Gynecology

## 2018-12-13 ENCOUNTER — Other Ambulatory Visit: Payer: Self-pay

## 2018-12-13 VITALS — BP 126/71 | HR 64 | Ht 63.0 in | Wt 141.0 lb

## 2018-12-13 DIAGNOSIS — Z1211 Encounter for screening for malignant neoplasm of colon: Secondary | ICD-10-CM

## 2018-12-13 DIAGNOSIS — N951 Menopausal and female climacteric states: Secondary | ICD-10-CM

## 2018-12-13 DIAGNOSIS — Z1231 Encounter for screening mammogram for malignant neoplasm of breast: Secondary | ICD-10-CM

## 2018-12-13 DIAGNOSIS — Z01419 Encounter for gynecological examination (general) (routine) without abnormal findings: Secondary | ICD-10-CM | POA: Diagnosis not present

## 2018-12-13 DIAGNOSIS — Z8639 Personal history of other endocrine, nutritional and metabolic disease: Secondary | ICD-10-CM

## 2018-12-13 DIAGNOSIS — E7849 Other hyperlipidemia: Secondary | ICD-10-CM

## 2018-12-13 NOTE — Progress Notes (Signed)
Pt present for annual exam. Pt's last pap 11/22/16.  Last mammogram 01/10/18. Pt declined flu vaccine. Pt stated that she was doing well and denies any issues at this time.

## 2018-12-13 NOTE — Progress Notes (Signed)
GYNECOLOGY ANNUAL PHYSICAL EXAM PROGRESS NOTE  Subjective:    Laura Ruiz is a 48 y.o. [phone] female who presents for an annual exam.  She is currently postmenopausal (x 3 years) and denies postmenopausal bleeding.   The patient is sexually active.  The patient wears seatbelts: yes. The patient participates in regular exercise: no. Has the patient ever been transfused or tattooed?: yes, tattoed. She reports that there is not domestic violence in her life.   Laura Ruiz has the following complaint(s) today:  1.  None   Gynecologic History  Menarche age: 12 Patient's last menstrual period was 04/10/2015. Patient is postmenopausal.  Contraception: tubal ligation History of STI's: Denies Last Pap: 11/2016. Results were: normal.  Notes h/o abnormal pap smear with colposcopy and biopsy in 2015. Last mammogram: 01/2018. Results were: normal.  Denies h/o abnormal mammograms.  Last colonoscopy: has never had one.    OB History  Gravida Para Term Preterm AB Living  4 3 3  0 1 3  SAB TAB Ectopic Multiple Live Births  1 0 0 0 3    # Outcome Date GA Lbr Len/2nd Weight Sex Delivery Anes PTL Lv  4 Term      Vag-Spont   LIV  3 SAB           2 Term      Vag-Spont   LIV  1 Term      Vag-Spont   LIV    Past Medical History:  Diagnosis Date  . Anxiety   . Hyperlipemia     Past Surgical History:  Procedure Laterality Date  . BREAST BIOPSY Left    neg  . TUBAL LIGATION  2001    Family History  Problem Relation Age of Onset  . Hypertension Mother   . Diabetes Father   . Hypertension Father   . COPD Father   . Cancer Sister   . Breast cancer Neg Hx     Social History   Socioeconomic History  . Marital status: Married    Spouse name: Not on file  . Number of children: Not on file  . Years of education: Not on file  . Highest education level: Not on file  Occupational History  . Not on file  Social Needs  . Financial resource strain: Not on file  . Food insecurity   Worry: Not on file    Inability: Not on file  . Transportation needs    Medical: Not on file    Non-medical: Not on file  Tobacco Use  . Smoking status: Never Smoker  . Smokeless tobacco: Never Used  Substance and Sexual Activity  . Alcohol use: No  . Drug use: No  . Sexual activity: Yes    Birth control/protection: Surgical  Lifestyle  . Physical activity    Days per week: Not on file    Minutes per session: Not on file  . Stress: Not on file  Relationships  . Social Herbalist on phone: Not on file    Gets together: Not on file    Attends religious service: Not on file    Active member of club or organization: Not on file    Attends meetings of clubs or organizations: Not on file    Relationship status: Not on file  . Intimate partner violence    Fear of current or ex partner: Not on file    Emotionally abused: Not on file    Physically abused: Not  on file    Forced sexual activity: Not on file  Other Topics Concern  . Not on file  Social History Narrative  . Not on file    Current Outpatient Medications on File Prior to Visit  Medication Sig Dispense Refill  . ALPRAZolam (XANAX) 0.5 MG tablet TAKE ONE (1) TABLET EACH DAY AS NEEDED FOR PANIC ATTACKS    . citalopram (CELEXA) 40 MG tablet TAKE ONE (1) TABLET EACH DAY    . Multiple Vitamin (MULTI-VITAMINS) TABS Take by mouth.    . Plecanatide (TRULANCE) 3 MG TABS Take by mouth.    . rosuvastatin (CRESTOR) 40 MG tablet Take 40 mg by mouth daily.    Marland Kitchen linaclotide (LINZESS) 290 MCG CAPS capsule TAKE ONE (1) CAPSULE EACH DAY    . Omega-3 Fatty Acids (FISH OIL PO) Take by mouth.     No current facility-administered medications on file prior to visit.     No Known Allergies   Review of Systems Constitutional: negative for fatigue, chills, fevers and sweats.  Eyes: negative for irritation, redness and visual disturbance Ears, nose, mouth, throat, and face: negative for hearing loss, nasal congestion, snoring  and tinnitus Respiratory: negative for asthma, cough, sputum Cardiovascular: negative for chest pain, dyspnea, exertional chest pressure/discomfort, irregular heart beat, palpitations and syncope Gastrointestinal: negative for abdominal pain, change in bowel habits, nausea and vomiting Genitourinary: negative for abnormal menstrual periods, genital lesions, sexual problems and vaginal discharge, dysuria and urinary incontinence Integument/breast: negative for breast lump, breast tenderness and nipple discharge Hematologic/lymphatic: negative for bleeding and easy bruising Musculoskeletal:negative for back pain and muscle weakness Neurological: negative for dizziness, headaches, vertigo and weakness Endocrine: negative for diabetic symptoms including polydipsia, polyuria and skin dryness.  Allergic/Immunologic: negative for hay fever and urticaria       Objective:  Blood pressure 126/71, pulse 64, height 5\' 3"  (1.6 m), weight 141 lb (64 kg), last menstrual period 04/10/2015. Body mass index is 24.98 kg/m.  General Appearance:    Alert, cooperative, no distress, appears stated age  Head:    Normocephalic, without obvious abnormality, atraumatic  Eyes:    PERRL, conjunctiva/corneas clear, EOM's intact, both eyes  Ears:    Normal external ear canals, both ears  Nose:   Nares normal, septum midline, mucosa normal, no drainage or sinus tenderness  Throat:   Lips, mucosa, and tongue normal; teeth and gums normal  Neck:   Supple, symmetrical, trachea midline, no adenopathy; thyroid: no enlargement/tenderness/nodules; no carotid bruit or JVD  Back:     Symmetric, no curvature, ROM normal, no CVA tenderness  Lungs:     Clear to auscultation bilaterally, respirations unlabored  Chest Wall:    No tenderness or deformity   Heart:    Regular rate and rhythm, S1 and S2 normal, no murmur, rub or gallop  Breast Exam:    No tenderness, masses, or nipple abnormality  Abdomen:     Soft, non-tender, bowel  sounds active all four quadrants, no masses, no organomegaly.    Genitalia:    Pelvic:external genitalia normal, vagina without lesions, discharge, or tenderness, rectovaginal septum  normal. Cervix normal in appearance, no cervical motion tenderness, no adnexal masses or tenderness.  Uterus normal size, shape, mobile, regular contours, nontender.  Rectal:    Normal external sphincter.  No hemorrhoids appreciated. Internal exam not done.   Extremities:   Extremities normal, atraumatic, no cyanosis or edema  Pulses:   2+ and symmetric all extremities  Skin:   Skin color,  texture, turgor normal, no rashes or lesions  Lymph nodes:   Cervical, supraclavicular, and axillary nodes normal  Neurologic:   CNII-XII intact, normal strength, sensation and reflexes throughout   .  Labs:   Lab Results  Component Value Date   TSH 0.974 11/22/2016   All other labs reviewed in Care Everywhere (07/04/2017 is date of last labs).  Assessment:   1. Encounter for well woman exam with routine gynecological exam   2. Colon cancer screening   3. Breast cancer screening by mammogram   4. Other hyperlipidemia   5. History of vitamin D deficiency   6. Climacteric     Plan:  - Labs: up to date.  Reviewed in Care Everywhere - Pap smear up to date.  Repeat in 1 year.   - Breast self exam technique reviewed and patient encouraged to perform self-exam monthly.  - Mammogram scheduled for January. - Contraception: tubal ligation. - Discussed healthy lifestyle modifications.   - History of Vitamin D deficiency, will check levels.  - Climacteric, mild symptoms not bothersome. No need for treatment.  - Mild dyslipidemia, no meds required. Followed by PCP.  - Declines flu vaccine.  - RTC in 1 year for annual exam.    Hildred Laserherry, Jailyn Leeson, MD [facility]

## 2018-12-13 NOTE — Patient Instructions (Addendum)
Preventive Care 40-50 Years Old, Female Preventive care refers to visits with your health care provider and lifestyle choices that can promote health and wellness. This includes:  A yearly physical exam. This may also be called an annual well check.  Regular dental visits and eye exams.  Immunizations.  Screening for certain conditions.  Healthy lifestyle choices, such as eating a healthy diet, getting regular exercise, not using drugs or products that contain nicotine and tobacco, and limiting alcohol use. What can I expect for my preventive care visit? Physical exam Your health care provider will check your:  Height and weight. This may be used to calculate body mass index (BMI), which tells if you are at a healthy weight.  Heart rate and blood pressure.  Skin for abnormal spots. Counseling Your health care provider may ask you questions about your:  Alcohol, tobacco, and drug use.  Emotional well-being.  Home and relationship well-being.  Sexual activity.  Eating habits.  Work and work environment.  Method of birth control.  Menstrual cycle.  Pregnancy history. What immunizations do I need?  Influenza (flu) vaccine  This is recommended every year. Tetanus, diphtheria, and pertussis (Tdap) vaccine  You may need a Td booster every 10 years. Varicella (chickenpox) vaccine  You may need this if you have not been vaccinated. Zoster (shingles) vaccine  You may need this after age 60. Measles, mumps, and rubella (MMR) vaccine  You may need at least one dose of MMR if you were born in 1957 or later. You may also need a second dose. Pneumococcal conjugate (PCV13) vaccine  You may need this if you have certain conditions and were not previously vaccinated. Pneumococcal polysaccharide (PPSV23) vaccine  You may need one or two doses if you smoke cigarettes or if you have certain conditions. Meningococcal conjugate (MenACWY) vaccine  You may need this if you  have certain conditions. Hepatitis A vaccine  You may need this if you have certain conditions or if you travel or work in places where you may be exposed to hepatitis A. Hepatitis B vaccine  You may need this if you have certain conditions or if you travel or work in places where you may be exposed to hepatitis B. Haemophilus influenzae type b (Hib) vaccine  You may need this if you have certain conditions. Human papillomavirus (HPV) vaccine  If recommended by your health care provider, you may need three doses over 6 months. You may receive vaccines as individual doses or as more than one vaccine together in one shot (combination vaccines). Talk with your health care provider about the risks and benefits of combination vaccines. What tests do I need? Blood tests  Lipid and cholesterol levels. These may be checked every 5 years, or more frequently if you are over 50 years old.  Hepatitis C test.  Hepatitis B test. Screening  Lung cancer screening. You may have this screening every year starting at age 55 if you have a 30-pack-year history of smoking and currently smoke or have quit within the past 15 years.  Colorectal cancer screening. All adults should have this screening starting at age 50 and continuing until age 75. Your health care provider may recommend screening at age 45 if you are at increased risk. You will have tests every 1-10 years, depending on your results and the type of screening test.  Diabetes screening. This is done by checking your blood sugar (glucose) after you have not eaten for a while (fasting). You may have this   done every 1-3 years.  Mammogram. This may be done every 1-2 years. Talk with your health care provider about when you should start having regular mammograms. This may depend on whether you have a family history of breast cancer.  BRCA-related cancer screening. This may be done if you have a family history of breast, ovarian, tubal, or peritoneal  cancers.  Pelvic exam and Pap test. This may be done every 3 years starting at age 21. Starting at age 30, this may be done every 5 years if you have a Pap test in combination with an HPV test. Other tests  Sexually transmitted disease (STD) testing.  Bone density scan. This is done to screen for osteoporosis. You may have this scan if you are at high risk for osteoporosis. Follow these instructions at home: Eating and drinking  Eat a diet that includes fresh fruits and vegetables, whole grains, lean protein, and low-fat dairy.  Take vitamin and mineral supplements as recommended by your health care provider.  Do not drink alcohol if: ? Your health care provider tells you not to drink. ? You are pregnant, may be pregnant, or are planning to become pregnant.  If you drink alcohol: ? Limit how much you have to 0-1 drink a day. ? Be aware of how much alcohol is in your drink. In the [place] one drink equals one 12 oz bottle of beer (355 mL), one 5 oz glass of wine (148 mL), or one 1 oz glass of hard liquor (44 mL). Lifestyle  Take daily care of your teeth and gums.  Stay active. Exercise for at least 30 minutes on 5 or more days each week.  Do not use any products that contain nicotine or tobacco, such as cigarettes, e-cigarettes, and chewing tobacco. If you need help quitting, ask your health care provider.  If you are sexually active, practice safe sex. Use a condom or other form of birth control (contraception) in order to prevent pregnancy and STIs (sexually transmitted infections).  If told by your health care provider, take low-dose aspirin daily starting at age 50. What's next?  Visit your health care provider once a year for a well check visit.  Ask your health care provider how often you should have your eyes and teeth checked.  Stay up to date on all vaccines. This information is not intended to replace advice given to you by your health care provider. Make sure you  discuss any questions you have with your health care provider. Document Released: 01/22/2015 Document Revised: 09/06/2017 Document Reviewed: 09/06/2017 Elsevier Patient Education  2020 Elsevier [employer]. Breast Self-Awareness Breast self-awareness is knowing how your breasts look and feel. Doing breast self-awareness is important. It allows you to catch a breast problem early while it is still small and can be treated. All women should do breast self-awareness, including women who have had breast implants. Tell your doctor if you notice a change in your breasts. What you need:  A mirror.  A well-lit room. How to do a breast self-exam A breast self-exam is one way to learn what is normal for your breasts and to check for changes. To do a breast self-exam: Look for changes  1. Take off all the clothes above your waist. 2. Stand in front of a mirror in a room with good lighting. 3. Put your hands on your hips. 4. Push your hands down. 5. Look at your breasts and nipples in the mirror to see if one breast or nipple looks   different from the other. Check to see if: ? The shape of one breast is different. ? The size of one breast is different. ? There are wrinkles, dips, and bumps in one breast and not the other. 6. Look at each breast for changes in the skin, such as: ? Redness. ? Scaly areas. 7. Look for changes in your nipples, such as: ? Liquid around the nipples. ? Bleeding. ? Dimpling. ? Redness. ? A change in where the nipples are. Feel for changes  1. Lie on your back on the floor. 2. Feel each breast. To do this, follow these steps: ? Pick a breast to feel. ? Put the arm closest to that breast above your head. ? Use your other arm to feel the nipple area of your breast. Feel the area with the pads of your three middle fingers by making small circles with your fingers. For the first circle, press lightly. For the second circle, press harder. For the third circle, press even harder.  ? Keep making circles with your fingers at the different pressures as you move down your breast. Stop when you feel your ribs. ? Move your fingers a little toward the center of your body. ? Start making circles with your fingers again, this time going up until you reach your collarbone. ? Keep making up-and-down circles until you reach your armpit. Remember to keep using the three pressures. ? Feel the other breast in the same way. 3. Sit or stand in the tub or shower. 4. With soapy water on your skin, feel each breast the same way you did in step 2 when you were lying on the floor. Write down what you find Writing down what you find can help you remember what to tell your doctor. Write down:  What is normal for each breast.  Any changes you find in each breast, including: ? The kind of changes you find. ? Whether you have pain. ? Size and location of any lumps.  When you last had your menstrual period. General tips  Check your breasts every month.  If you are breastfeeding, the best time to check your breasts is after you feed your baby or after you use a breast pump.  If you get menstrual periods, the best time to check your breasts is 5-7 days after your menstrual period is over.  With time, you will become comfortable with the self-exam, and you will begin to know if there are changes in your breasts. Contact a doctor if you:  See a change in the shape or size of your breasts or nipples.  See a change in the skin of your breast or nipples, such as red or scaly skin.  Have fluid coming from your nipples that is not normal.  Find a lump or thick area that was not there before.  Have pain in your breasts.  Have any concerns about your breast health. Summary  Breast self-awareness includes looking for changes in your breasts, as well as feeling for changes within your breasts.  Breast self-awareness should be done in front of a mirror in a well-lit room.  You should  check your breasts every month. If you get menstrual periods, the best time to check your breasts is 5-7 days after your menstrual period is over.  Let your doctor know of any changes you see in your breasts, including changes in size, changes on the skin, pain or tenderness, or fluid from your nipples that is not normal. This   information is not intended to replace advice given to you by your health care provider. Make sure you discuss any questions you have with your health care provider. Document Released: 06/14/2007 Document Revised: 08/14/2017 Document Reviewed: 08/14/2017 Elsevier Patient Education  2020 Elsevier Inc.  

## 2018-12-13 NOTE — Addendum Note (Signed)
Addended by: Augusto Gamble on: 12/13/2018 09:13 AM   Modules accepted: Orders

## 2018-12-13 NOTE — Telephone Encounter (Signed)
Gastroenterology Pre-Procedure Review  Request Date: 01/06/19 Requesting Physician: Dr. Bonna Gains  PATIENT REVIEW QUESTIONS: The patient responded to the following health history questions as indicated:    1. Are you having any GI issues? yes (controlled IBS) 2. Do you have a personal history of Polyps? no 3. Do you have a family history of Colon Cancer or Polyps? no 4. Diabetes Mellitus? no 5. Joint replacements in the past 12 months?no 6. Major health problems in the past 3 months?no 7. Any artificial heart valves, MVP, or defibrillator?no    MEDICATIONS & ALLERGIES:    Patient reports the following regarding taking any anticoagulation/antiplatelet therapy:   Plavix, Coumadin, Eliquis, Xarelto, Lovenox, Pradaxa, Brilinta, or Effient? no Aspirin? no  Patient confirms/reports the following medications:  Current Outpatient Medications  Medication Sig Dispense Refill  . ALPRAZolam (XANAX) 0.5 MG tablet TAKE ONE (1) TABLET EACH DAY AS NEEDED FOR PANIC ATTACKS    . citalopram (CELEXA) 40 MG tablet TAKE ONE (1) TABLET EACH DAY    . linaclotide (LINZESS) 290 MCG CAPS capsule TAKE ONE (1) CAPSULE EACH DAY    . Multiple Vitamin (MULTI-VITAMINS) TABS Take by mouth.    . Omega-3 Fatty Acids (FISH OIL PO) Take by mouth.    . Plecanatide (TRULANCE) 3 MG TABS Take by mouth.    . rosuvastatin (CRESTOR) 40 MG tablet Take 40 mg by mouth daily.     No current facility-administered medications for this visit.     Patient confirms/reports the following allergies:  No Known Allergies  No orders of the defined types were placed in this encounter.   AUTHORIZATION INFORMATION Primary Insurance: 1D#: Group #:  Secondary Insurance: 1D#: Group #:  SCHEDULE INFORMATION: Date: 01/06/19 Time: Location:[facility]

## 2018-12-14 LAB — VITAMIN D 25 HYDROXY (VIT D DEFICIENCY, FRACTURES): Vit D, 25-Hydroxy: 28.7 ng/mL — ABNORMAL LOW (ref 30.0–100.0)

## 2018-12-18 ENCOUNTER — Telehealth: Payer: Self-pay

## 2018-12-18 NOTE — Telephone Encounter (Signed)
Patient rec colo-guard through the mail. Was unsure of why & wanted to see if Dr. Marcelline Mates ordered the kit. Was expecting a colonoscopy. Please call patient to advise

## 2018-12-20 NOTE — Telephone Encounter (Signed)
Pt called and informed that she could canceled that order. I will call and contact company and cancel her order to stop billing on kit.

## 2018-12-24 NOTE — Telephone Encounter (Signed)
Spoke with pt and she is aware that her cologuard had been canceled and a referral to see a GI for colon cancer screening has been placed.

## 2019-01-02 ENCOUNTER — Other Ambulatory Visit: Admission: RE | Admit: 2019-01-02 | Payer: BC Managed Care – PPO | Source: Ambulatory Visit

## 2019-01-06 ENCOUNTER — Ambulatory Visit
Admission: RE | Admit: 2019-01-06 | Payer: BC Managed Care – PPO | Source: Home / Self Care | Admitting: Gastroenterology

## 2019-01-06 ENCOUNTER — Telehealth: Payer: Self-pay

## 2019-01-06 ENCOUNTER — Encounter: Admission: RE | Payer: Self-pay | Source: Home / Self Care

## 2019-01-06 SURGERY — COLONOSCOPY WITH PROPOFOL
Anesthesia: General

## 2019-01-06 MED ORDER — PROPOFOL 10 MG/ML IV BOLUS
INTRAVENOUS | Status: AC
  Filled 2019-01-06: qty 80

## 2019-01-06 NOTE — Telephone Encounter (Signed)
Called and left a message for call back to rescheduled Colonoscopy since patient did not go for COVID test.

## 2019-01-21 ENCOUNTER — Ambulatory Visit
Admission: RE | Admit: 2019-01-21 | Discharge: 2019-01-21 | Disposition: A | Discharge status: Home / Self Care | Payer: BC Managed Care – PPO | Source: Ambulatory Visit | Attending: Family Medicine | Admitting: Family Medicine

## 2019-01-21 DIAGNOSIS — Z1231 Encounter for screening mammogram for malignant neoplasm of breast: Secondary | ICD-10-CM

## 2019-11-24 ENCOUNTER — Other Ambulatory Visit: Payer: Self-pay | Admitting: Nurse Practitioner

## 2019-12-18 ENCOUNTER — Encounter: Payer: BC Managed Care – PPO | Admitting: Obstetrics and Gynecology

## 2019-12-19 ENCOUNTER — Encounter: Payer: BC Managed Care – PPO | Admitting: Obstetrics and Gynecology

## 2020-03-09 ENCOUNTER — Ambulatory Visit (INDEPENDENT_AMBULATORY_CARE_PROVIDER_SITE_OTHER): Payer: Self-pay | Admitting: Obstetrics and Gynecology

## 2020-03-09 ENCOUNTER — Other Ambulatory Visit: Payer: Self-pay

## 2020-03-09 ENCOUNTER — Encounter: Payer: Self-pay | Admitting: Obstetrics and Gynecology

## 2020-03-09 ENCOUNTER — Other Ambulatory Visit (HOSPITAL_COMMUNITY)
Admission: RE | Admit: 2020-03-09 | Discharge: 2020-03-09 | Disposition: A | Discharge status: Home / Self Care | Payer: BC Managed Care – PPO | Source: Ambulatory Visit | Attending: Obstetrics and Gynecology | Admitting: Obstetrics and Gynecology

## 2020-03-09 VITALS — BP 112/78 | HR 76 | Ht 63.0 in | Wt 143.5 lb

## 2020-03-09 DIAGNOSIS — Z01419 Encounter for gynecological examination (general) (routine) without abnormal findings: Secondary | ICD-10-CM

## 2020-03-09 DIAGNOSIS — Z1231 Encounter for screening mammogram for malignant neoplasm of breast: Secondary | ICD-10-CM

## 2020-03-09 DIAGNOSIS — E7849 Other hyperlipidemia: Secondary | ICD-10-CM

## 2020-03-09 DIAGNOSIS — Z8639 Personal history of other endocrine, nutritional and metabolic disease: Secondary | ICD-10-CM

## 2020-03-09 DIAGNOSIS — Z124 Encounter for screening for malignant neoplasm of cervix: Secondary | ICD-10-CM | POA: Insufficient documentation

## 2020-03-09 NOTE — Progress Notes (Signed)
Annual exam-pt stated that she was doing well.  

## 2020-03-09 NOTE — Progress Notes (Signed)
GYNECOLOGY ANNUAL PHYSICAL EXAM PROGRESS NOTE  Subjective:    Laura Ruiz is a 50 y.o. [phone] postmenopausal female who presents for an annual exam.  She denies postmenopausal bleeding.   The patient is sexually active.  The patient wears seatbelts: yes. The patient participates in regular exercise: no. Has the patient ever been transfused or tattooed?: yes, tattoed. She reports that there is not domestic violence in her life.   Laura Ruiz has the following complaint(s) today:  1.  None   Gynecologic History  Menarche age: 29 Patient's last menstrual period was 04/10/2015. Patient is postmenopausal.  Contraception: tubal ligation, postmenopausal History of STI's: Denies Last Pap: 11/2016. Results were: normal.  Notes h/o abnormal pap smear with colposcopy and biopsy in 2015.No other issues.  Last mammogram: 01/21/2019. Results were: normal.  Denies h/o abnormal mammograms.  Last colonoscopy: 01/19/2015. Results were normal.    OB History  Gravida Para Term Preterm AB Living  4 3 3  0 1 3  SAB IAB Ectopic Multiple Live Births  1 0 0 0 3    # Outcome Date GA Lbr Len/2nd Weight Sex Delivery Anes PTL Lv  4 Term      Vag-Spont   LIV  3 SAB           2 Term      Vag-Spont   LIV  1 Term      Vag-Spont   LIV    Past Medical History:  Diagnosis Date  . Anxiety   . Hyperlipemia     Past Surgical History:  Procedure Laterality Date  . BREAST BIOPSY Left    neg  . TUBAL LIGATION  2001    Family History  Problem Relation Age of Onset  . Hypertension Mother   . Diabetes Father   . Hypertension Father   . COPD Father   . Cancer Sister   . Breast cancer Neg Hx     Social History   Socioeconomic History  . Marital status: Married    Spouse name: Not on file  . Number of children: Not on file  . Years of education: Not on file  . Highest education level: Not on file  Occupational History  . Not on file  Tobacco Use  . Smoking status: Never Smoker  . Smokeless  tobacco: Never Used  Vaping Use  . Vaping Use: Never used  Substance and Sexual Activity  . Alcohol use: No  . Drug use: No  . Sexual activity: Yes    Birth control/protection: Surgical  Other Topics Concern  . Not on file  Social History Narrative  . Not on file   Social Determinants of Health   Financial Resource Strain: Not on file  Food Insecurity: Not on file  Transportation Needs: Not on file  Physical Activity: Not on file  Stress: Not on file  Social Connections: Not on file  Intimate Partner Violence: Not on file    Current Outpatient Medications on File Prior to Visit  Medication Sig Dispense Refill  . ALPRAZolam (XANAX) 0.5 MG tablet TAKE ONE (1) TABLET EACH DAY AS NEEDED FOR PANIC ATTACKS    . citalopram (CELEXA) 40 MG tablet TAKE ONE (1) TABLET EACH DAY    . Multiple Vitamin (MULTI-VITAMINS) TABS Take by mouth.    . rosuvastatin (CRESTOR) 40 MG tablet Take 40 mg by mouth daily.    . TRULANCE 3 MG TABS TAKE 1 TABLET BY MOUTH DAILY 30 tablet 1   No  current facility-administered medications on file prior to visit.    No Known Allergies   Review of Systems Constitutional: negative for fatigue, chills, fevers and sweats.  Eyes: negative for irritation, redness and visual disturbance Ears, nose, mouth, throat, and face: negative for hearing loss, nasal congestion, snoring and tinnitus Respiratory: negative for asthma, cough, sputum Cardiovascular: negative for chest pain, dyspnea, exertional chest pressure/discomfort, irregular heart beat, palpitations and syncope Gastrointestinal: negative for abdominal pain, change in bowel habits, nausea and vomiting Genitourinary: negative for abnormal menstrual periods, genital lesions, sexual problems and vaginal discharge, dysuria and urinary incontinence Integument/breast: negative for breast lump, breast tenderness and nipple discharge Hematologic/lymphatic: negative for bleeding and easy  bruising Musculoskeletal:negative for back pain and muscle weakness Neurological: negative for dizziness, headaches, vertigo and weakness Endocrine: negative for diabetic symptoms including polydipsia, polyuria and skin dryness.  Allergic/Immunologic: negative for hay fever and urticaria       Objective:  Blood pressure 112/78, pulse 76, height 5\' 3"  (1.6 m), weight 143 lb 8 oz (65.1 kg), last menstrual period 04/10/2015. Body mass index is 25.42 kg/m.  General Appearance:    Alert, cooperative, no distress, appears stated age  Head:    Normocephalic, without obvious abnormality, atraumatic  Eyes:    PERRL, conjunctiva/corneas clear, EOM's intact, both eyes  Ears:    Normal external ear canals, both ears  Nose:   Nares normal, septum midline, mucosa normal, no drainage or sinus tenderness  Throat:   Lips, mucosa, and tongue normal; teeth and gums normal  Neck:   Supple, symmetrical, trachea midline, no adenopathy; thyroid: no enlargement/tenderness/nodules; no carotid bruit or JVD  Back:     Symmetric, no curvature, ROM normal, no CVA tenderness  Lungs:     Clear to auscultation bilaterally, respirations unlabored  Chest Wall:    No tenderness or deformity   Heart:    Regular rate and rhythm, S1 and S2 normal, no murmur, rub or gallop  Breast Exam:    No tenderness, masses, or nipple abnormality  Abdomen:     Soft, non-tender, bowel sounds active all four quadrants, no masses, no organomegaly.    Genitalia:    Pelvic:external genitalia normal, vagina without lesions, discharge, or tenderness, rectovaginal septum  normal. Cervix normal in appearance, no cervical motion tenderness, no adnexal masses or tenderness.  Uterus normal size, shape, mobile, regular contours, nontender.  Rectal:    Normal external sphincter.  No hemorrhoids appreciated. Internal exam not done.   Extremities:   Extremities normal, atraumatic, no cyanosis or edema  Pulses:   2+ and symmetric all extremities  Skin:    Skin color, texture, turgor normal, no rashes or lesions  Lymph nodes:   Cervical, supraclavicular, and axillary nodes normal  Neurologic:   CNII-XII intact, normal strength, sensation and reflexes throughout   .  Labs:   Labs reviewed in Care Everywhere (08/2019).   Assessment:   1. Encounter for well woman exam with routine gynecological exam   2. Pap smear for cervical cancer screening   3. Breast cancer screening by mammogram   4. History of vitamin D deficiency   5. Other hyperlipidemia     Plan:  - Labs: up to date.  Reviewed in Care Everywhere - Pap smear performed today.  - Breast self exam technique reviewed and patient encouraged to perform self-exam monthly.  - Mammogram ordered. - Contraception: tubal ligation. - Discussed healthy lifestyle modifications. - History of Vitamin D deficiency, recommend recheck with next labs.  -  Mild dyslipidemia, no meds required. Followed by PCP.  - Declines flu vaccine.  - Declines COVID vaccine. - RTC in 1 year for annual exam.    Hildred Laser, MD [facility]

## 2020-03-09 NOTE — Patient Instructions (Signed)
Preventive Care 65-52 Years Old, Female Preventive care refers to lifestyle choices and visits with your health care provider that can promote health and wellness. This includes:  A yearly physical exam. This is also called an annual wellness visit.  Regular dental and eye exams.  Immunizations.  Screening for certain conditions.  Healthy lifestyle choices, such as: ? Eating a healthy diet. ? Getting regular exercise. ? Not using drugs or products that contain nicotine and tobacco. ? Limiting alcohol use. What can I expect for my preventive care visit? Physical exam Your health care provider will check your:  Height and weight. These may be used to calculate your BMI (body mass index). BMI is a measurement that tells if you are at a healthy weight.  Heart rate and blood pressure.  Body temperature.  Skin for abnormal spots. Counseling Your health care provider may ask you questions about your:  Past medical problems.  Family's medical history.  Alcohol, tobacco, and drug use.  Emotional well-being.  Home life and relationship well-being.  Sexual activity.  Diet, exercise, and sleep habits.  Work and work Statistician.  Access to firearms.  Method of birth control.  Menstrual cycle.  Pregnancy history. What immunizations do I need? Vaccines are usually given at various ages, according to a schedule. Your health care provider will recommend vaccines for you based on your age, medical history, and lifestyle or other factors, such as travel or where you work.   What tests do I need? Blood tests  Lipid and cholesterol levels. These may be checked every 5 years, or more often if you are over 52 years old.  Hepatitis C test.  Hepatitis B test. Screening  Lung cancer screening. You may have this screening every year starting at age 66 if you have a 30-pack-year history of smoking and currently smoke or have quit within the past 15 years.  Colorectal cancer  screening. ? All adults should have this screening starting at age 52 and continuing until age 38. ? Your health care provider may recommend screening at age 67 if you are at increased risk. ? You will have tests every 1-10 years, depending on your results and the type of screening test.  Diabetes screening. ? This is done by checking your blood sugar (glucose) after you have not eaten for a while (fasting). ? You may have this done every 1-3 years.  Mammogram. ? This may be done every 1-2 years. ? Talk with your health care provider about when you should start having regular mammograms. This may depend on whether you have a family history of breast cancer.  BRCA-related cancer screening. This may be done if you have a family history of breast, ovarian, tubal, or peritoneal cancers.  Pelvic exam and Pap test. ? This may be done every 3 years starting at age 25. ? Starting at age 20, this may be done every 5 years if you have a Pap test in combination with an HPV test. Other tests  STD (sexually transmitted disease) testing, if you are at risk.  Bone density scan. This is done to screen for osteoporosis. You may have this scan if you are at high risk for osteoporosis. Talk with your health care provider about your test results, treatment options, and if necessary, the need for more tests. Follow these instructions at home: Eating and drinking  Eat a diet that includes fresh fruits and vegetables, whole grains, lean protein, and low-fat dairy products.  Take vitamin and mineral supplements  as recommended by your health care provider.  Do not drink alcohol if: ? Your health care provider tells you not to drink. ? You are pregnant, may be pregnant, or are planning to become pregnant.  If you drink alcohol: ? Limit how much you have to 0-1 drink a day. ? Be aware of how much alcohol is in your drink. In the [place] one drink equals one 12 oz bottle of beer (355 mL), one 5 oz glass of  wine (148 mL), or one 1 oz glass of hard liquor (44 mL).   Lifestyle  Take daily care of your teeth and gums. Brush your teeth every morning and night with fluoride toothpaste. Floss one time each day.  Stay active. Exercise for at least 30 minutes 5 or more days each week.  Do not use any products that contain nicotine or tobacco, such as cigarettes, e-cigarettes, and chewing tobacco. If you need help quitting, ask your health care provider.  Do not use drugs.  If you are sexually active, practice safe sex. Use a condom or other form of protection to prevent STIs (sexually transmitted infections).  If you do not wish to become pregnant, use a form of birth control. If you plan to become pregnant, see your health care provider for a prepregnancy visit.  If told by your health care provider, take low-dose aspirin daily starting at age 75.  Find healthy ways to cope with stress, such as: ? Meditation, yoga, or listening to music. ? Journaling. ? Talking to a trusted person. ? Spending time with friends and family. Safety  Always wear your seat belt while driving or riding in a vehicle.  Do not drive: ? If you have been drinking alcohol. Do not ride with someone who has been drinking. ? When you are tired or distracted. ? While texting.  Wear a helmet and other protective equipment during sports activities.  If you have firearms in your house, make sure you follow all gun safety procedures. What's next?  Visit your health care provider once a year for an annual wellness visit.  Ask your health care provider how often you should have your eyes and teeth checked.  Stay up to date on all vaccines. This information is not intended to replace advice given to you by your health care provider. Make sure you discuss any questions you have with your health care provider. Document Revised: 09/30/2019 Document Reviewed: 09/06/2017 Elsevier Patient Education  2021 [place] Breast self-awareness is knowing how your breasts look and feel. Doing breast self-awareness is important. It allows you to catch a breast problem early while it is still small and can be treated. All women should do breast self-awareness, including women who have had breast implants. Tell your doctor if you notice a change in your breasts. What you need:  A mirror.  A well-lit room. How to do a breast self-exam A breast self-exam is one way to learn what is normal for your breasts and to check for changes. To do a breast self-exam: Look for changes 1. Take off all the clothes above your waist. 2. Stand in front of a mirror in a room with good lighting. 3. Put your hands on your hips. 4. Push your hands down. 5. Look at your breasts and nipples in the mirror to see if one breast or nipple looks different from the other. Check to see if: ? The shape of one breast is different. ? The size of  one breast is different. ? There are wrinkles, dips, and bumps in one breast and not the other. 6. Look at each breast for changes in the skin, such as: ? Redness. ? Scaly areas. 7. Look for changes in your nipples, such as: ? Liquid around the nipples. ? Bleeding. ? Dimpling. ? Redness. ? A change in where the nipples are.   Feel for changes 1. Lie on your back on the floor. 2. Feel each breast. To do this, follow these steps: ? Pick a breast to feel. ? Put the arm closest to that breast above your head. ? Use your other arm to feel the nipple area of your breast. Feel the area with the pads of your three middle fingers by making small circles with your fingers. For the first circle, press lightly. For the second circle, press harder. For the third circle, press even harder. ? Keep making circles with your fingers at the different pressures as you move down your breast. Stop when you feel your ribs. ? Move your fingers a little toward the center of your body. ? Start making  circles with your fingers again, this time going up until you reach your collarbone. ? Keep making up-and-down circles until you reach your armpit. Remember to keep using the three pressures. ? Feel the other breast in the same way. 3. Sit or stand in the tub or shower. 4. With soapy water on your skin, feel each breast the same way you did in step 2 when you were lying on the floor.   Write down what you find Writing down what you find can help you remember what to tell your doctor. Write down:  What is normal for each breast.  Any changes you find in each breast, including: ? The kind of changes you find. ? Whether you have pain. ? Size and location of any lumps.  When you last had your menstrual period. General tips  Check your breasts every month.  If you are breastfeeding, the best time to check your breasts is after you feed your baby or after you use a breast pump.  If you get menstrual periods, the best time to check your breasts is 5-7 days after your menstrual period is over.  With time, you will become comfortable with the self-exam, and you will begin to know if there are changes in your breasts. Contact a doctor if you:  See a change in the shape or size of your breasts or nipples.  See a change in the skin of your breast or nipples, such as red or scaly skin.  Have fluid coming from your nipples that is not normal.  Find a lump or thick area that was not there before.  Have pain in your breasts.  Have any concerns about your breast health. Summary  Breast self-awareness includes looking for changes in your breasts, as well as feeling for changes within your breasts.  Breast self-awareness should be done in front of a mirror in a well-lit room.  You should check your breasts every month. If you get menstrual periods, the best time to check your breasts is 5-7 days after your menstrual period is over.  Let your doctor know of any changes you see in your  breasts, including changes in size, changes on the skin, pain or tenderness, or fluid from your nipples that is not normal. This information is not intended to replace advice given to you by your health care provider. Make sure  you discuss any questions you have with your health care provider. Document Revised: 08/14/2017 Document Reviewed: 08/14/2017 Elsevier Patient Education  [place].

## 2020-03-12 LAB — CYTOLOGY - PAP
Comment: NEGATIVE
Diagnosis: NEGATIVE
High risk HPV: NEGATIVE

## 2020-08-26 ENCOUNTER — Other Ambulatory Visit: Payer: Self-pay

## 2020-08-26 ENCOUNTER — Ambulatory Visit
Admission: RE | Admit: 2020-08-26 | Discharge: 2020-08-26 | Disposition: A | Discharge status: Home / Self Care | Payer: BC Managed Care – PPO | Source: Ambulatory Visit | Attending: Obstetrics and Gynecology | Admitting: Obstetrics and Gynecology

## 2020-08-26 DIAGNOSIS — Z01419 Encounter for gynecological examination (general) (routine) without abnormal findings: Secondary | ICD-10-CM

## 2020-08-26 DIAGNOSIS — Z1231 Encounter for screening mammogram for malignant neoplasm of breast: Secondary | ICD-10-CM | POA: Diagnosis not present

## 2020-10-13 ENCOUNTER — Other Ambulatory Visit: Payer: Self-pay | Admitting: Family Medicine

## 2020-10-13 DIAGNOSIS — M5416 Radiculopathy, lumbar region: Secondary | ICD-10-CM

## 2020-10-21 ENCOUNTER — Other Ambulatory Visit: Payer: Self-pay

## 2020-10-21 ENCOUNTER — Ambulatory Visit
Admission: RE | Admit: 2020-10-21 | Discharge: 2020-10-21 | Disposition: A | Discharge status: Home / Self Care | Payer: BC Managed Care – PPO | Source: Ambulatory Visit | Attending: Family Medicine | Admitting: Family Medicine

## 2020-10-21 DIAGNOSIS — M5416 Radiculopathy, lumbar region: Secondary | ICD-10-CM | POA: Insufficient documentation

## 2021-08-11 ENCOUNTER — Encounter: Payer: Self-pay | Admitting: Obstetrics and Gynecology

## 2021-09-21 NOTE — Progress Notes (Signed)
GYNECOLOGY ANNUAL PHYSICAL EXAM PROGRESS NOTE  Subjective:    Laura Ruiz is a 51 y.o. [phone] postmenopausal female who presents for an annual exam.  She denies postmenopausal bleeding.   The patient is sexually active.  The patient wears seatbelts: yes. The patient participates in regular exercise: no. Has the patient ever been transfused or tattooed?:  yes, tattoed . She reports that there is not domestic violence in her life.   Laura Ruiz has the following complaint(s) today:  1.  None   Gynecologic History  Menarche age: 38 Patient's last menstrual period was 04/10/2015. Patient is postmenopausal.  Contraception: tubal ligation, postmenopausal History of STI's: Denies Last Pap: 03/09/2020. Results were: normal.  Notes h/o abnormal pap smear with colposcopy and biopsy in 2015.No other issues.  Last mammogram: 08/26/2020. Results were: normal.  Denies h/o abnormal mammograms.  Last colonoscopy: 01/19/2015. Results were normal.    OB History  Gravida Para Term Preterm AB Living  4 3 3  0 1 3  SAB IAB Ectopic Multiple Live Births  1 0 0 0 3    # Outcome Date GA Lbr Len/2nd Weight Sex Delivery Anes PTL Lv  4 Term      Vag-Spont   LIV  3 SAB           2 Term      Vag-Spont   LIV  1 Term      Vag-Spont   LIV    Past Medical History:  Diagnosis Date   Anxiety    Hyperlipemia     Past Surgical History:  Procedure Laterality Date   BREAST BIOPSY Left    neg   TUBAL LIGATION  2001    Family History  Problem Relation Age of Onset   Hypertension Mother    Diabetes Father    Hypertension Father    COPD Father    Cancer Sister    Breast cancer Neg Hx     Social History   Socioeconomic History   Marital status: Married    Spouse name: Not on file   Number of children: Not on file   Years of education: Not on file   Highest education level: Not on file  Occupational History   Not on file  Tobacco Use   Smoking status: Never   Smokeless tobacco: Never  Vaping  Use   Vaping Use: Never used  Substance and Sexual Activity   Alcohol use: No   Drug use: No   Sexual activity: Yes    Birth control/protection: Surgical  Other Topics Concern   Not on file  Social History Narrative   Not on file   Social Determinants of Health   Financial Resource Strain: Not on file  Food Insecurity: Not on file  Transportation Needs: Not on file  Physical Activity: Not on file  Stress: Not on file  Social Connections: Not on file  Intimate Partner Violence: Not on file    Current Outpatient Medications on File Prior to Visit  Medication Sig Dispense Refill   ALPRAZolam (XANAX) 0.5 MG tablet TAKE ONE (1) TABLET EACH DAY AS NEEDED FOR PANIC ATTACKS     citalopram (CELEXA) 40 MG tablet TAKE ONE (1) TABLET EACH DAY     Multiple Vitamin (MULTI-VITAMINS) TABS Take by mouth.     rosuvastatin (CRESTOR) 40 MG tablet Take 40 mg by mouth daily.     TRULANCE 3 MG TABS TAKE 1 TABLET BY MOUTH DAILY 30 tablet 1   No  current facility-administered medications on file prior to visit.    No Known Allergies   Review of Systems Constitutional: negative for fatigue, chills, fevers and sweats.  Eyes: negative for irritation, redness and visual disturbance Ears, nose, mouth, throat, and face: negative for hearing loss, nasal congestion, snoring and tinnitus Respiratory: negative for asthma, cough, sputum Cardiovascular: negative for chest pain, dyspnea, exertional chest pressure/discomfort, irregular heart beat, palpitations and syncope Gastrointestinal: negative for abdominal pain, change in bowel habits, nausea and vomiting Genitourinary: negative for abnormal menstrual periods, genital lesions, sexual problems and vaginal discharge, dysuria and urinary incontinence Integument/breast: negative for breast lump, breast tenderness and nipple discharge Hematologic/lymphatic: negative for bleeding and easy bruising Musculoskeletal:negative for back pain and muscle  weakness Neurological: negative for dizziness, headaches, vertigo and weakness Endocrine: negative for diabetic symptoms including polydipsia, polyuria and skin dryness.  Allergic/Immunologic: negative for hay fever and urticaria       Objective:  Blood pressure 129/66, pulse 76, height 5\' 3"  (1.6 m), weight 145 lb 14.4 oz (66.2 kg), last menstrual period 04/10/2015. Body mass index is 25.85 kg/m.  General Appearance:    Alert, cooperative, no distress, appears stated age  Head:    Normocephalic, without obvious abnormality, atraumatic  Eyes:    PERRL, conjunctiva/corneas clear, EOM's intact, both eyes  Ears:    Normal external ear canals, both ears  Nose:   Nares normal, septum midline, mucosa normal, no drainage or sinus tenderness  Throat:   Lips, mucosa, and tongue normal; teeth and gums normal  Neck:   Supple, symmetrical, trachea midline, no adenopathy; thyroid: no enlargement/tenderness/nodules; no carotid bruit or JVD  Back:     Symmetric, no curvature, ROM normal, no CVA tenderness  Lungs:     Clear to auscultation bilaterally, respirations unlabored  Chest Wall:    No tenderness or deformity   Heart:    Regular rate and rhythm, S1 and S2 normal, no murmur, rub or gallop  Breast Exam:    No tenderness, masses, or nipple abnormality  Abdomen:     Soft, non-tender, bowel sounds active all four quadrants, no masses, no organomegaly.    Genitalia:    Pelvic:external genitalia normal, vagina without lesions, discharge, or tenderness, rectovaginal septum  normal. Cervix normal in appearance, no cervical motion tenderness, no adnexal masses or tenderness.  Uterus normal size, shape, mobile, regular contours, nontender.  Rectal:    Normal external sphincter.  No hemorrhoids appreciated. Internal exam not done.   Extremities:   Extremities normal, atraumatic, no cyanosis or edema  Pulses:   2+ and symmetric all extremities  Skin:   Skin color, texture, turgor normal, no rashes or lesions   Lymph nodes:   Cervical, supraclavicular, and axillary nodes normal  Neurologic:   CNII-XII intact, normal strength, sensation and reflexes throughout   .  Labs:   Labs reviewed in Care Everywhere (08/2020 and 02/2021).   Assessment:   1. Encounter for well woman exam with routine gynecological exam   2. Encounter for screening mammogram for malignant neoplasm of breast   3. Menopause      Plan:  - Labs: up to date.  Reviewed in Care Everywhere - Pap smear up to date.  - Breast self exam technique reviewed and patient encouraged to perform self-exam monthly.  - Mammogram ordered. - Contraception: tubal ligation. - Discussed healthy lifestyle modifications. - Menopausal, asymptomatic. - Declines flu vaccine.  - Declines COVID vaccine. - RTC in 1 year for annual exam.  Rubie Maid, MD [facility]

## 2021-09-22 ENCOUNTER — Encounter: Payer: Self-pay | Admitting: Obstetrics and Gynecology

## 2021-09-22 ENCOUNTER — Ambulatory Visit (INDEPENDENT_AMBULATORY_CARE_PROVIDER_SITE_OTHER): Payer: BC Managed Care – PPO | Admitting: Obstetrics and Gynecology

## 2021-09-22 VITALS — BP 129/66 | HR 76 | Ht 63.0 in | Wt 145.9 lb

## 2021-09-22 DIAGNOSIS — Z780 Asymptomatic menopausal state: Secondary | ICD-10-CM | POA: Diagnosis not present

## 2021-09-22 DIAGNOSIS — Z01419 Encounter for gynecological examination (general) (routine) without abnormal findings: Secondary | ICD-10-CM | POA: Diagnosis not present

## 2021-09-22 DIAGNOSIS — Z1231 Encounter for screening mammogram for malignant neoplasm of breast: Secondary | ICD-10-CM

## 2021-09-22 NOTE — Progress Notes (Deleted)
GYNECOLOGY ANNUAL PHYSICAL EXAM PROGRESS NOTE  Subjective:    Laura Ruiz is a 51 y.o. [phone] female who presents for an annual exam. The patient has no complaints today. The patient is sexually active. The patient participates in regular exercise: yes. Has the patient ever been transfused or tattooed?: yes. The patient reports that there is not domestic violence in her life.    Menstrual History: Menarche age: 30 Patient's last menstrual period was 04/10/2015.     Gynecologic History:  Contraception: post menopausal status and tubal ligation History of STI's: Denies Last Pap: 03/09/2020. Results were: normal.  Notes h/o abnormal pap smear with colposcopy and biopsy in 2015.No other issues. Last mammogram: 08/26/2020. Results were: normal   OB History  Gravida Para Term Preterm AB Living  4 3 3  0 1 3  SAB IAB Ectopic Multiple Live Births  1 0 0 0 3    # Outcome Date GA Lbr Len/2nd Weight Sex Delivery Anes PTL Lv  4 Term      Vag-Spont   LIV  3 SAB           2 Term      Vag-Spont   LIV  1 Term      Vag-Spont   LIV    Past Medical History:  Diagnosis Date   Anxiety    Hyperlipemia     Past Surgical History:  Procedure Laterality Date   BREAST BIOPSY Left    neg   TUBAL LIGATION  2001    Family History  Problem Relation Age of Onset   Hypertension Mother    Diabetes Father    Hypertension Father    COPD Father    Cancer Sister    Breast cancer Neg Hx     Social History   Socioeconomic History   Marital status: Married    Spouse name: Not on file   Number of children: Not on file   Years of education: Not on file   Highest education level: Not on file  Occupational History   Not on file  Tobacco Use   Smoking status: Never   Smokeless tobacco: Never  Vaping Use   Vaping Use: Never used  Substance and Sexual Activity   Alcohol use: No   Drug use: No   Sexual activity: Yes    Birth control/protection: Surgical  Other Topics Concern    Not on file  Social History Narrative   Not on file   Social Determinants of Health   Financial Resource Strain: Not on file  Food Insecurity: Not on file  Transportation Needs: Not on file  Physical Activity: Not on file  Stress: Not on file  Social Connections: Not on file  Intimate Partner Violence: Not on file    Current Outpatient Medications on File Prior to Visit  Medication Sig Dispense Refill   ALPRAZolam (XANAX) 0.5 MG tablet TAKE ONE (1) TABLET EACH DAY AS NEEDED FOR PANIC ATTACKS     citalopram (CELEXA) 40 MG tablet TAKE ONE (1) TABLET EACH DAY     Multiple Vitamin (MULTI-VITAMINS) TABS Take by mouth.     rosuvastatin (CRESTOR) 40 MG tablet Take 40 mg by mouth daily.     TRULANCE 3 MG TABS TAKE 1 TABLET BY MOUTH DAILY 30 tablet 1   No current facility-administered medications on file prior to visit.    No Known Allergies   Review of Systems Constitutional: negative for chills, fatigue, fevers and sweats Eyes: negative for  irritation, redness and visual disturbance Ears, nose, mouth, throat, and face: negative for hearing loss, nasal congestion, snoring and tinnitus Respiratory: negative for asthma, cough, sputum Cardiovascular: negative for chest pain, dyspnea, exertional chest pressure/discomfort, irregular heart beat, palpitations and syncope Gastrointestinal: negative for abdominal pain, change in bowel habits, nausea and vomiting Genitourinary: negative for abnormal menstrual periods, genital lesions, sexual problems and vaginal discharge, dysuria and urinary incontinence Integument/breast: negative for breast lump, breast tenderness and nipple discharge Hematologic/lymphatic: negative for bleeding and easy bruising Musculoskeletal:negative for back pain and muscle weakness Neurological: negative for dizziness, headaches, vertigo and weakness Endocrine: negative for diabetic symptoms including polydipsia, polyuria and skin dryness Allergic/Immunologic:  negative for hay fever and urticaria      Objective:  Last menstrual period 04/10/2015. There is no height or weight on file to calculate BMI.    General Appearance:    Alert, cooperative, no distress, appears stated age  Head:    Normocephalic, without obvious abnormality, atraumatic  Eyes:    PERRL, conjunctiva/corneas clear, EOM's intact, both eyes  Ears:    Normal external ear canals, both ears  Nose:   Nares normal, septum midline, mucosa normal, no drainage or sinus tenderness  Throat:   Lips, mucosa, and tongue normal; teeth and gums normal  Neck:   Supple, symmetrical, trachea midline, no adenopathy; thyroid: no enlargement/tenderness/nodules; no carotid bruit or JVD  Back:     Symmetric, no curvature, ROM normal, no CVA tenderness  Lungs:     Clear to auscultation bilaterally, respirations unlabored  Chest Wall:    No tenderness or deformity   Heart:    Regular rate and rhythm, S1 and S2 normal, no murmur, rub or gallop  Breast Exam:    No tenderness, masses, or nipple abnormality  Abdomen:     Soft, non-tender, bowel sounds active all four quadrants, no masses, no organomegaly.    Genitalia:    Pelvic:external genitalia normal, vagina without lesions, discharge, or tenderness, rectovaginal septum  normal. Cervix normal in appearance, no cervical motion tenderness, no adnexal masses or tenderness.  Uterus normal size, shape, mobile, regular contours, nontender.  Rectal:    Normal external sphincter.  No hemorrhoids appreciated. Internal exam not done.   Extremities:   Extremities normal, atraumatic, no cyanosis or edema  Pulses:   2+ and symmetric all extremities  Skin:   Skin color, texture, turgor normal, no rashes or lesions  Lymph nodes:   Cervical, supraclavicular, and axillary nodes normal  Neurologic:   CNII-XII intact, normal strength, sensation and reflexes throughout   .  Labs:  No results found for: "WBC", "HGB", "HCT", "MCV", "PLT"  No results found for:  "CREATININE", "BUN", "NA", "K", "CL", "CO2"  No results found for: "ALT", "AST", "GGT", "ALKPHOS", "BILITOT"  Lab Results  Component Value Date   TSH 0.974 11/22/2016     Assessment:   No diagnosis found.   Plan:  Blood tests: {blood tests:13147}. Breast self exam technique reviewed and patient encouraged to perform self-exam monthly. Contraception: tubal ligation. Discussed healthy lifestyle modifications. Mammogram ordered Pap smear  UTD . COVID vaccination status: Follow up in 1 year for annual exam   Hildred Laser, MD [facility]

## 2021-09-27 ENCOUNTER — Telehealth: Payer: Self-pay | Admitting: Obstetrics and Gynecology

## 2021-09-27 NOTE — Telephone Encounter (Signed)
Patient calling to see if her results have come back from blood work?   Cb# [phone]

## 2021-09-27 NOTE — Telephone Encounter (Signed)
Called patient with results for her pap smear, no blood work was done at this visit. Told patient results were normal. She verbalized understanding.

## 2021-10-25 ENCOUNTER — Ambulatory Visit
Admission: RE | Admit: 2021-10-25 | Discharge: 2021-10-25 | Disposition: A | Discharge status: Home / Self Care | Payer: BC Managed Care – PPO | Source: Ambulatory Visit | Attending: Obstetrics and Gynecology | Admitting: Obstetrics and Gynecology

## 2021-10-25 DIAGNOSIS — R921 Mammographic calcification found on diagnostic imaging of breast: Secondary | ICD-10-CM | POA: Insufficient documentation

## 2021-10-25 DIAGNOSIS — R920 Mammographic microcalcification found on diagnostic imaging of breast: Secondary | ICD-10-CM | POA: Diagnosis not present

## 2021-10-25 DIAGNOSIS — Z01419 Encounter for gynecological examination (general) (routine) without abnormal findings: Secondary | ICD-10-CM | POA: Diagnosis present

## 2021-10-25 DIAGNOSIS — Z1231 Encounter for screening mammogram for malignant neoplasm of breast: Secondary | ICD-10-CM | POA: Insufficient documentation

## 2021-10-27 ENCOUNTER — Other Ambulatory Visit: Payer: Self-pay | Admitting: Obstetrics and Gynecology

## 2021-10-27 ENCOUNTER — Encounter: Payer: Self-pay | Admitting: Obstetrics and Gynecology

## 2021-10-27 DIAGNOSIS — R928 Other abnormal and inconclusive findings on diagnostic imaging of breast: Secondary | ICD-10-CM

## 2021-10-27 DIAGNOSIS — R921 Mammographic calcification found on diagnostic imaging of breast: Secondary | ICD-10-CM

## 2021-10-28 ENCOUNTER — Other Ambulatory Visit: Payer: Self-pay

## 2021-10-28 ENCOUNTER — Other Ambulatory Visit: Payer: Self-pay | Admitting: Obstetrics and Gynecology

## 2021-10-28 DIAGNOSIS — R928 Other abnormal and inconclusive findings on diagnostic imaging of breast: Secondary | ICD-10-CM

## 2021-10-28 DIAGNOSIS — R921 Mammographic calcification found on diagnostic imaging of breast: Secondary | ICD-10-CM

## 2021-11-03 ENCOUNTER — Ambulatory Visit
Admission: RE | Admit: 2021-11-03 | Discharge: 2021-11-03 | Disposition: A | Discharge status: Home / Self Care | Payer: BC Managed Care – PPO | Source: Ambulatory Visit | Attending: Obstetrics and Gynecology | Admitting: Obstetrics and Gynecology

## 2021-11-03 DIAGNOSIS — R921 Mammographic calcification found on diagnostic imaging of breast: Secondary | ICD-10-CM | POA: Insufficient documentation

## 2021-11-03 DIAGNOSIS — R928 Other abnormal and inconclusive findings on diagnostic imaging of breast: Secondary | ICD-10-CM | POA: Diagnosis present

## 2022-03-17 IMAGING — MR MR LUMBAR SPINE W/O CM
5 series · 31 of 48 positions shown · non-contrast
Comparison: CT abdomen and pelvis 06/06/2018

CLINICAL DATA: Lumbar radiculitis. Low back pain radiating down the
right leg for 1 year.

EXAM:
MRI LUMBAR SPINE WITHOUT CONTRAST
TECHNIQUE: Multiplanar, multisequence MR imaging of the lumbar spine was
performed. No intravenous contrast was administered.

[Series 5: T2 · sagittal · 4.0mm · 0.81mm/px · 6 of 17 slices shown (1 of 2)]
[im 1/17]
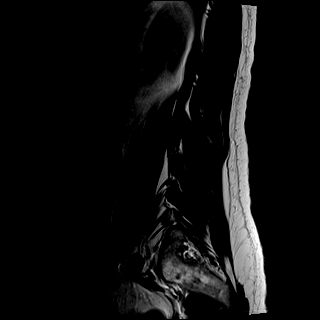
[im 4/17]
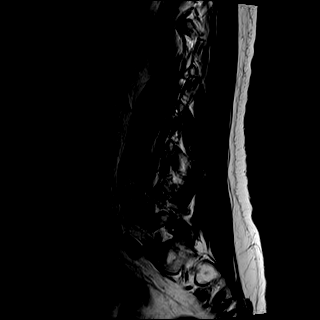
[im 7/17]
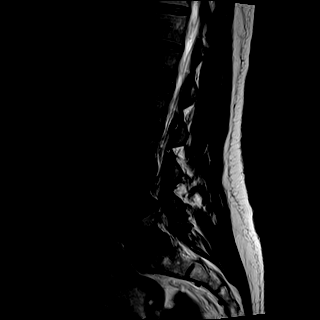
[im 10/17]
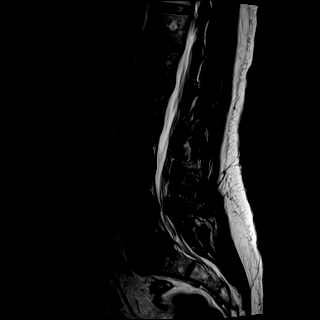
[im 13/17]
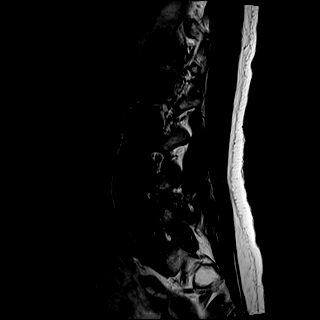
[im 17/17]
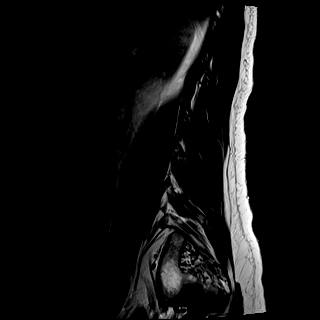

[Series 6: T1 · sagittal · 4.0mm · 0.81mm/px · 7 of 17 slices shown (1 of 2)]
[im 1/17]
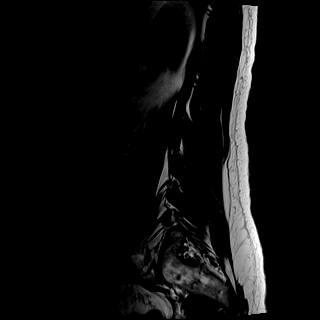
[im 3/17]
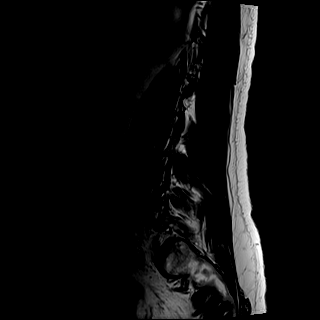
[im 6/17]
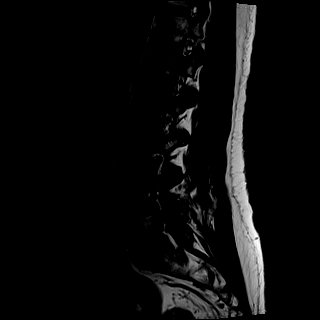
[im 9/17]
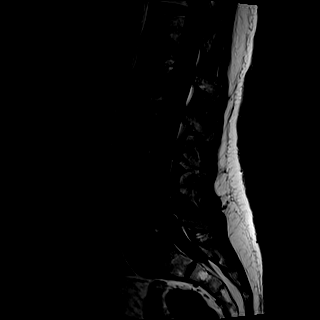
[im 11/17]
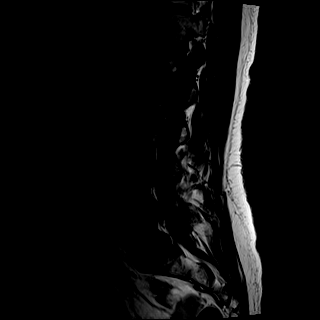
[im 14/17]
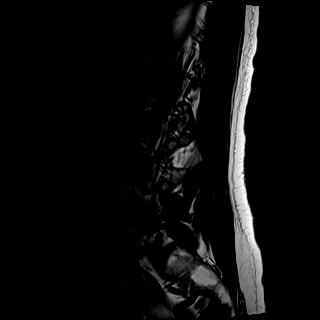
[im 17/17]
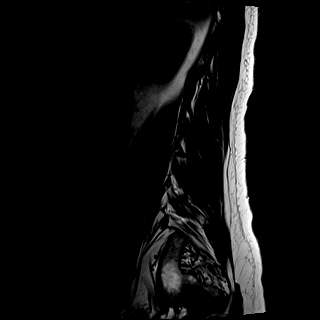

[Series 7: STIR · sagittal · 4.0mm · 0.41mm/px · 2 of 17 slices shown]
[im 1/17]
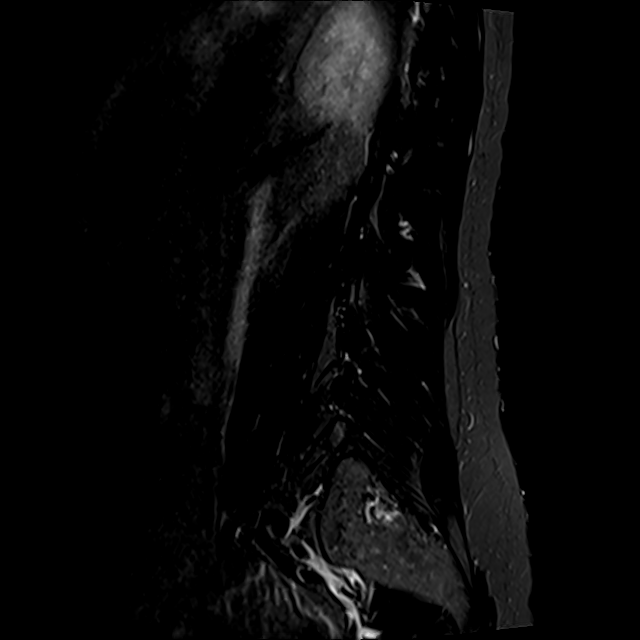
[im 3/17]
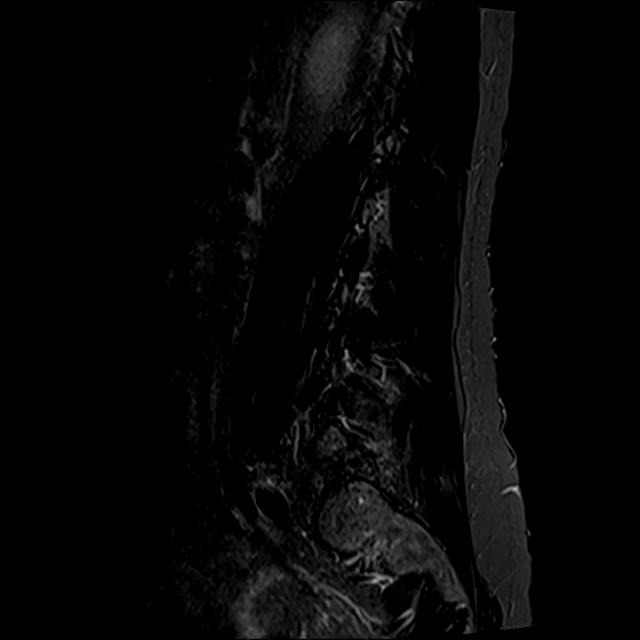

[Series 8: T2 · axial · 4.0mm · 0.78mm/px · z∈[-87,+131]mm · 8 of 37 slices shown (2 of 2)]
[im 1/37]
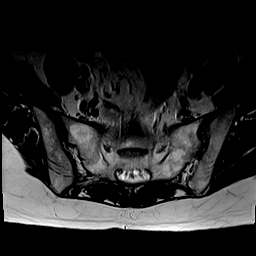
[im 6/37]
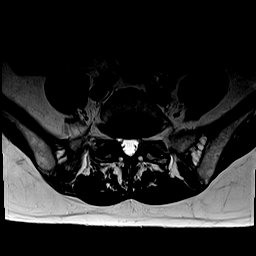
[im 12/37]
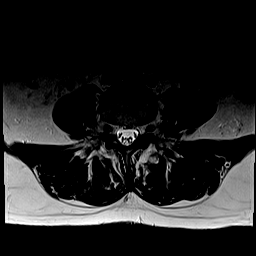
[im 17/37]
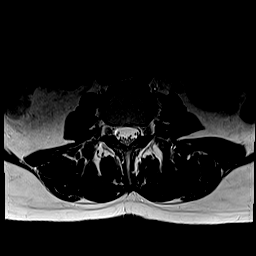
[im 20/37]
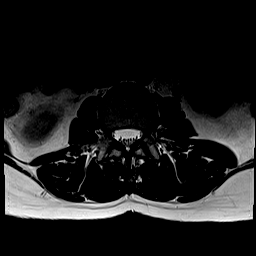
[im 25/37]
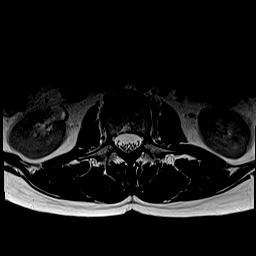
[im 31/37]
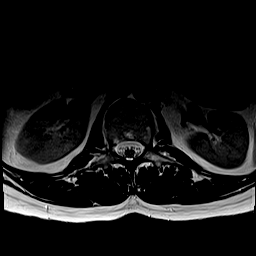
[im 37/37]
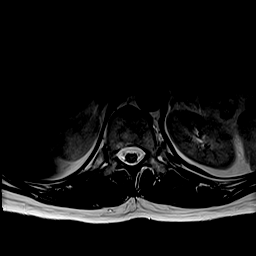

[Series 9: T1 · axial · 4.0mm · 0.39mm/px · z∈[-87,+131]mm · 8 of 37 slices shown (2 of 2)]
[im 1/37]
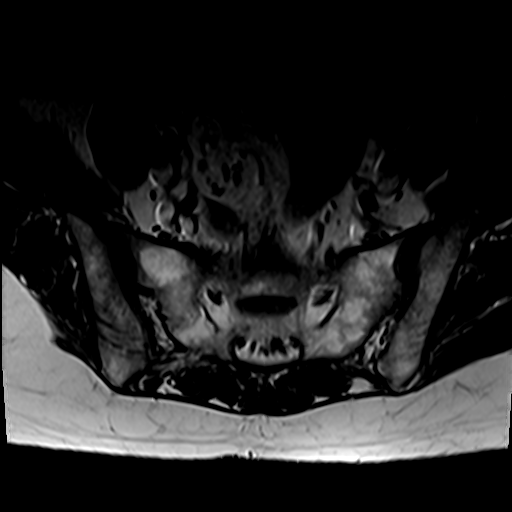
[im 6/37]
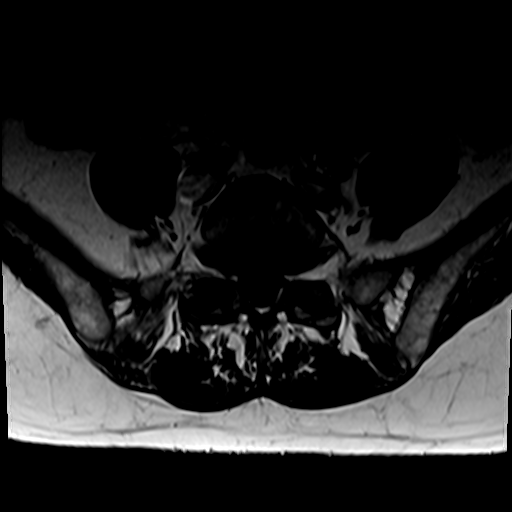
[im 12/37]
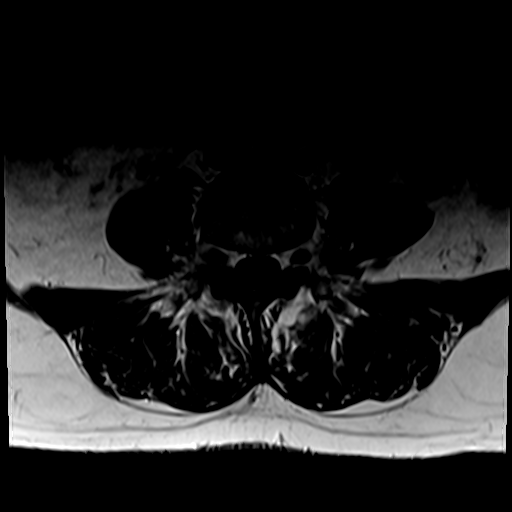
[im 17/37]
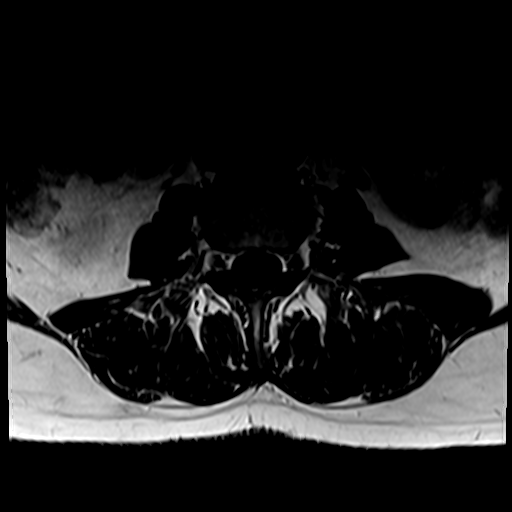
[im 20/37]
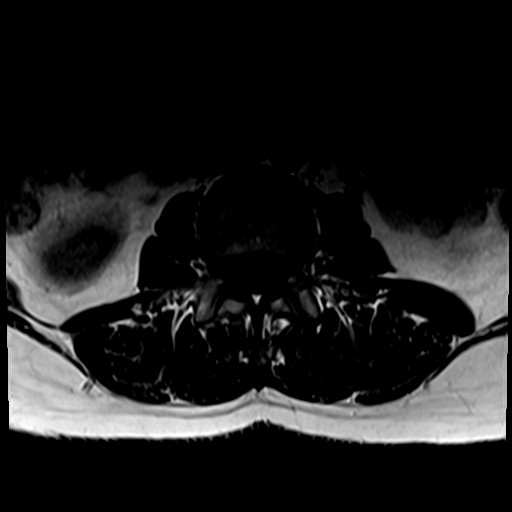
[im 25/37]
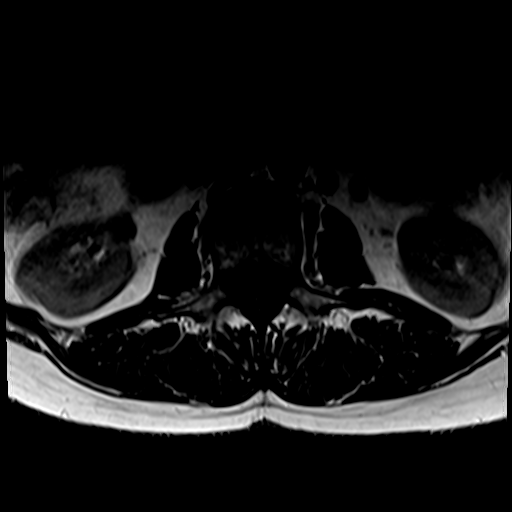
[im 31/37]
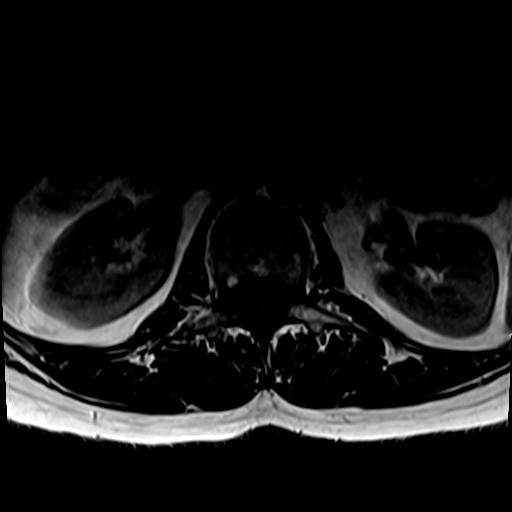
[im 37/37]
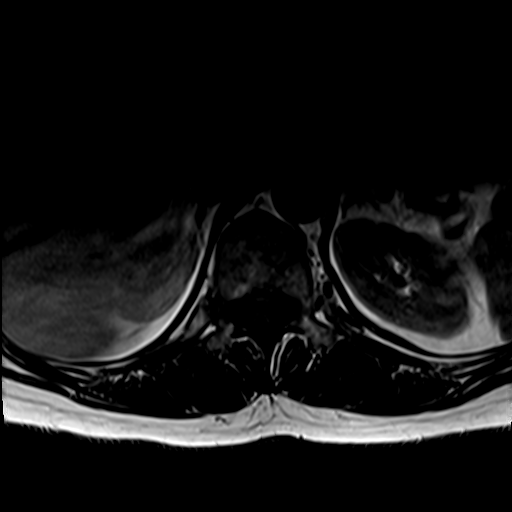

[31 of 48 positions shown; findings below may reference images not displayed]

FINDINGS: Segmentation:  Standard.

Alignment:  Normal.

Vertebrae: No fracture or suspicious marrow lesion. Right-sided
facet edema at L4-5.

Conus medullaris and cauda equina: Conus extends to the L2 level.
Conus and cauda equina appear normal.

Paraspinal and other soft tissues: Unremarkable.

Disc levels:

Mild disc desiccation, greatest at L5-S1. Preserved disc space
heights.

T12-L1 through L2-3: Negative.

L3-4: Mild disc bulging and mild facet hypertrophy without stenosis.

L4-5: Disc bulging and severe facet and ligamentum flavum
hypertrophy result in mild-to-moderate spinal stenosis,
mild-to-moderate right and mild left lateral recess stenosis, and
moderate right and mild left neural foraminal stenosis.

L5-S1: Mild disc bulging and moderate to severe facet hypertrophy
result in mild right neural foraminal stenosis without spinal
stenosis.
IMPRESSION: Lumbar spondylosis and facet arthrosis, greatest at L4-5 where there
is right-sided facet edema, mild-to-moderate spinal stenosis, and
moderate right neural foraminal stenosis.

## 2022-04-05 ENCOUNTER — Other Ambulatory Visit: Payer: Self-pay | Admitting: Obstetrics and Gynecology

## 2022-04-05 DIAGNOSIS — R921 Mammographic calcification found on diagnostic imaging of breast: Secondary | ICD-10-CM

## 2022-04-28 ENCOUNTER — Ambulatory Visit
Admission: RE | Admit: 2022-04-28 | Discharge: 2022-04-28 | Disposition: A | Discharge status: Home / Self Care | Payer: BC Managed Care – PPO | Source: Ambulatory Visit | Attending: Obstetrics and Gynecology | Admitting: Obstetrics and Gynecology

## 2022-04-28 DIAGNOSIS — R921 Mammographic calcification found on diagnostic imaging of breast: Secondary | ICD-10-CM

## 2022-05-24 ENCOUNTER — Other Ambulatory Visit: Payer: Self-pay | Admitting: Obstetrics and Gynecology

## 2022-05-24 DIAGNOSIS — R921 Mammographic calcification found on diagnostic imaging of breast: Secondary | ICD-10-CM

## 2022-05-24 DIAGNOSIS — R928 Other abnormal and inconclusive findings on diagnostic imaging of breast: Secondary | ICD-10-CM

## 2022-05-24 DIAGNOSIS — Z1231 Encounter for screening mammogram for malignant neoplasm of breast: Secondary | ICD-10-CM

## 2022-10-27 ENCOUNTER — Ambulatory Visit
Admission: RE | Admit: 2022-10-27 | Discharge: 2022-10-27 | Disposition: A | Discharge status: Home / Self Care | Payer: BC Managed Care – PPO | Source: Ambulatory Visit | Attending: Obstetrics and Gynecology | Admitting: Obstetrics and Gynecology

## 2022-10-27 DIAGNOSIS — R921 Mammographic calcification found on diagnostic imaging of breast: Secondary | ICD-10-CM | POA: Insufficient documentation

## 2022-10-27 DIAGNOSIS — R928 Other abnormal and inconclusive findings on diagnostic imaging of breast: Secondary | ICD-10-CM | POA: Insufficient documentation

## 2022-10-27 DIAGNOSIS — Z1231 Encounter for screening mammogram for malignant neoplasm of breast: Secondary | ICD-10-CM | POA: Insufficient documentation

## 2023-05-14 NOTE — Patient Instructions (Signed)
 Preventive Care 16-55 Years Old, Female  Preventive care refers to lifestyle choices and visits with your health care provider that can promote health and wellness. Preventive care visits are also called wellness exams.  What can I expect for my preventive care visit?  Counseling  Your health care provider may ask you questions about your:  Medical history, including:  Past medical problems.  Family medical history.  Pregnancy history.  Current health, including:  Menstrual cycle.  Method of birth control.  Emotional well-being.  Home life and relationship well-being.  Sexual activity and sexual health.  Lifestyle, including:  Alcohol, nicotine or tobacco, and drug use.  Access to firearms.  Diet, exercise, and sleep habits.  Work and work Astronomer.  Sunscreen use.  Safety issues such as seatbelt and bike helmet use.  Physical exam  Your health care provider will check your:  Height and weight. These may be used to calculate your BMI (body mass index). BMI is a measurement that tells if you are at a healthy weight.  Waist circumference. This measures the distance around your waistline. This measurement also tells if you are at a healthy weight and may help predict your risk of certain diseases, such as type 2 diabetes and high blood pressure.  Heart rate and blood pressure.  Body temperature.  Skin for abnormal spots.  What immunizations do I need?    Vaccines are usually given at various ages, according to a schedule. Your health care provider will recommend vaccines for you based on your age, medical history, and lifestyle or other factors, such as travel or where you work.  What tests do I need?  Screening  Your health care provider may recommend screening tests for certain conditions. This may include:  Lipid and cholesterol levels.  Diabetes screening. This is done by checking your blood sugar (glucose) after you have not eaten for a while (fasting).  Pelvic exam and Pap test.  Hepatitis B test.  Hepatitis C  test.  HIV (human immunodeficiency virus) test.  STI (sexually transmitted infection) testing, if you are at risk.  Lung cancer screening.  Colorectal cancer screening.  Mammogram. Talk with your health care provider about when you should start having regular mammograms. This may depend on whether you have a family history of breast cancer.  BRCA-related cancer screening. This may be done if you have a family history of breast, ovarian, tubal, or peritoneal cancers.  Bone density scan. This is done to screen for osteoporosis.  Talk with your health care provider about your test results, treatment options, and if necessary, the need for more tests.  Follow these instructions at home:  Eating and drinking    Eat a diet that includes fresh fruits and vegetables, whole grains, lean protein, and low-fat dairy products.  Take vitamin and mineral supplements as recommended by your health care provider.  Do not drink alcohol if:  Your health care provider tells you not to drink.  You are pregnant, may be pregnant, or are planning to become pregnant.  If you drink alcohol:  Limit how much you have to 0-1 drink a day.  Know how much alcohol is in your drink. In the [place] one drink equals one 12 oz bottle of beer (355 mL), one 5 oz glass of wine (148 mL), or one 1 oz glass of hard liquor (44 mL).  Lifestyle  Brush your teeth every morning and night with fluoride toothpaste. Floss one time each day.  Exercise for at least  30 minutes 5 or more days each week.  Do not use any products that contain nicotine or tobacco. These products include cigarettes, chewing tobacco, and vaping devices, such as e-cigarettes. If you need help quitting, ask your health care provider.  Do not use drugs.  If you are sexually active, practice safe sex. Use a condom or other form of protection to prevent STIs.  If you do not wish to become pregnant, use a form of birth control. If you plan to become pregnant, see your health care provider for a  prepregnancy visit.  Take aspirin only as told by your health care provider. Make sure that you understand how much to take and what form to take. Work with your health care provider to find out whether it is safe and beneficial for you to take aspirin daily.  Find healthy ways to manage stress, such as:  Meditation, yoga, or listening to music.  Journaling.  Talking to a trusted person.  Spending time with friends and family.  Minimize exposure to UV radiation to reduce your risk of skin cancer.  Safety  Always wear your seat belt while driving or riding in a vehicle.  Do not drive:  If you have been drinking alcohol. Do not ride with someone who has been drinking.  When you are tired or distracted.  While texting.  If you have been using any mind-altering substances or drugs.  Wear a helmet and other protective equipment during sports activities.  If you have firearms in your house, make sure you follow all gun safety procedures.  Seek help if you have been physically or sexually abused.  What's next?  Visit your health care provider once a year for an annual wellness visit.  Ask your health care provider how often you should have your eyes and teeth checked.  Stay up to date on all vaccines.  This information is not intended to replace advice given to you by your health care provider. Make sure you discuss any questions you have with your health care provider.  Document Revised: 06/23/2020 Document Reviewed: 06/23/2020  Elsevier Patient Education  2024 [employer].

## 2023-05-14 NOTE — Progress Notes (Unsigned)
 GYNECOLOGY: ANNUAL EXAM   Subjective:    PCP: Nestor Banter, MD Laura Ruiz is a 53 y.o. female [phone] who presents for annual wellness visit.   Well Woman Visit:  GYN HISTORY:  Patient's last menstrual period was 04/10/2015.     Menstrual History: OB History     Gravida  4   Para  3   Term  3   Preterm      AB  1   Living  3      SAB  1   IAB      Ectopic      Multiple      Live Births  47           Menarche age: 70 Patient's last menstrual period was 04/10/2015.    Intermenstrual bleeding, spotting, or discharge? no Urinary incontinence? no  Sexually active: yes Number of sexual partners: 1 Gender of sexual Partners: male Social History   Substance and Sexual Activity  Sexual Activity Yes   Birth control/protection: Surgical   Contraceptive methods: bilateral tubal ligation Dyspareunia? no STI history: hpv STI/HIV testing or immunizations needed? No.   Health Maintenance: -Last pap: approximate date 03/09/20 and was normal NILM HPV NEG --> Any abnormals: yes  -Last mammogram: 10/27/22 --> Any abnormals? yes -Last colon cancer screen: not been 10 years / Type: colonoscopy -Last DEXA scan: no -[facility] of Breast / Colon / Cervical cancer: yes -Vaccines:  There is no immunization history on file for this patient. Last Tdap: unsure / Flu: no / COVID: no / Gardasil: age out / Shingles (50+): no / PCV20:  -Hep C screen: pcp -Last lipid / glucose screening: PCP  > Exercise: , moderately active > Dietary Supplements: Folate: No;  Calcium: No}; Vitamin D : No > Body mass index is 23.74 kg/m.  > Recent dental visit Yes.   > Seat Belt Use: Yes.   > Texting and driving? No. > Guns in the house Yes.   > Recreational or other drug use: denied.   Social History   Tobacco Use   Smoking status: Never   Smokeless tobacco: Never  Substance Use Topics   Alcohol use: No   Occupation: [employer]    Lives with: husband    PHQ-2  Score: In last two weeks, how often have you felt: Little interest or pleasure in doing things: Not at all (0) Feeling down, depressed or hopeless: Not at all (0) Score: 0  GAD-2 Over the last 2 weeks, how often have you been bothered by the following problems? Feeling nervous, anxious or on edge: Not at all (0) Not being able to stop or control worrying: Not at all (0)} Score: 0 _________________________________________________________  Current Outpatient Medications  Medication Sig Dispense Refill   ALPRAZolam (XANAX) 0.5 MG tablet TAKE ONE (1) TABLET EACH DAY AS NEEDED FOR PANIC ATTACKS     citalopram (CELEXA) 40 MG tablet TAKE ONE (1) TABLET EACH DAY     Multiple Vitamin (MULTI-VITAMINS) TABS Take by mouth.     rosuvastatin (CRESTOR) 40 MG tablet Take 40 mg by mouth daily.     TRULANCE 3 MG TABS TAKE 1 TABLET BY MOUTH DAILY 30 tablet 1   No current facility-administered medications for this visit.   No Known Allergies  Past Medical History:  Diagnosis Date   Anxiety    Hyperlipemia    Past Surgical History:  Procedure Laterality Date   BREAST EXCISIONAL BIOPSY Left    Benign  TUBAL LIGATION  2001    Review Of Systems  Constitutional: Denied constitutional symptoms, night sweats, recent illness, fatigue, fever, insomnia and weight loss.  Eyes: Denied eye symptoms, eye pain, photophobia, vision change and visual disturbance.  Ears/Nose/Throat/Neck: Denied ear, nose, throat or neck symptoms, hearing loss, nasal discharge, sinus congestion and sore throat.  Cardiovascular: Denied cardiovascular symptoms, arrhythmia, chest pain/pressure, edema, exercise intolerance, orthopnea and palpitations.  Respiratory: Denied pulmonary symptoms, asthma, pleuritic pain, productive sputum, cough, dyspnea and wheezing.  Gastrointestinal: Denied, gastro-esophageal reflux, melena, nausea and vomiting.  Genitourinary: Denied genitourinary symptoms including symptomatic vaginal discharge,  pelvic relaxation issues, and urinary complaints.  Musculoskeletal: Denied musculoskeletal symptoms, stiffness, swelling, muscle weakness and myalgia.  Dermatologic: Denied dermatology symptoms, rash and scar.  Neurologic: Denied neurology symptoms, dizziness, headache, neck pain and syncope.  Psychiatric: Denied psychiatric symptoms, anxiety and depression.  Endocrine: Mild hot flashes      Objective:    BP 122/71   Pulse 64   Ht 5\' 3"  (1.6 m)   Wt 134 lb (60.8 kg)   LMP 04/10/2015   BMI 23.74 kg/m   Constitutional: Well-developed, well-nourished female in no acute distress Neurological: Alert and oriented to person, place, and time Psychiatric: Mood and affect appropriate Skin: No rashes or lesions Neck: Supple without masses. Trachea is midline.Thyroid is normal size without masses Lymphatics: No cervical, axillary, supraclavicular, or inguinal adenopathy noted Respiratory: Clear to auscultation bilaterally. Good air movement with normal work of breathing. Cardiovascular: Regular rate and rhythm. Extremities grossly normal, nontender with no edema; pulses regular Gastrointestinal: Soft, nontender, nondistended. No masses or hernias appreciated. No hepatosplenomegaly. No fluid wave. No rebound or guarding. Breast Exam: normal appearance, no masses or tenderness, Inspection negative, No nipple retraction or dimpling, No nipple discharge or bleeding, No axillary or supraclavicular adenopathy Genitourinary:         External Genitalia: mildly atrophic    Vagina: Normal mucosa, no lesions.    Cervix: No lesions, normal size and consistency; no cervical motion tenderness; non-friable; Pap not obtained.    Uterus: Normal size and contour; smooth, mobile, NT. Adnexae: Non-palpable and non-tender Perineum/Anus: No lesions Rectal: deferred    Assessment/Plan:    Laura Ruiz is a 53 y.o. female [phone] with normal well-woman gynecologic exam.  -Screenings:  Pap: due  03/2025 Mammogram: ordered Colon: PCP Labs: PCP GAD/PHQ-2 = 0 -Healthy lifestyle modifications discussed: multivitamin, diet, exercise, sunscreen, tobacco and alcohol use. Emphasized importance of regular physical activity.  -Calcium and Vit D recommendation reviewed.  -All questions answered to patient's satisfaction.  -RTC 1 yr for annual, sooner prn.     Sofia Dunn, DO [facility]

## 2023-05-15 ENCOUNTER — Encounter: Payer: Self-pay | Admitting: Obstetrics

## 2023-05-15 ENCOUNTER — Ambulatory Visit (INDEPENDENT_AMBULATORY_CARE_PROVIDER_SITE_OTHER): Admitting: Obstetrics

## 2023-05-15 VITALS — BP 122/71 | HR 64 | Ht 63.0 in | Wt 134.0 lb

## 2023-05-15 DIAGNOSIS — Z01419 Encounter for gynecological examination (general) (routine) without abnormal findings: Secondary | ICD-10-CM | POA: Diagnosis not present

## 2023-05-15 DIAGNOSIS — Z1231 Encounter for screening mammogram for malignant neoplasm of breast: Secondary | ICD-10-CM

## 2023-10-29 ENCOUNTER — Ambulatory Visit
Admission: RE | Admit: 2023-10-29 | Discharge: 2023-10-29 | Disposition: A | Discharge status: Home / Self Care | Source: Ambulatory Visit | Attending: Obstetrics | Admitting: Obstetrics

## 2023-10-29 DIAGNOSIS — Z1231 Encounter for screening mammogram for malignant neoplasm of breast: Secondary | ICD-10-CM | POA: Diagnosis present

## 2023-10-29 DIAGNOSIS — Z01419 Encounter for gynecological examination (general) (routine) without abnormal findings: Secondary | ICD-10-CM
